# Patient Record
Sex: Male | Born: 1975
Health system: Southern US, Community
[De-identification: ages and names within clinical notes are randomized; demographics above are authoritative.]

## PROBLEM LIST (undated history)

## (undated) DIAGNOSIS — M549 Dorsalgia, unspecified: Secondary | ICD-10-CM

## (undated) DIAGNOSIS — I10 Essential (primary) hypertension: Secondary | ICD-10-CM

## (undated) DIAGNOSIS — G8929 Other chronic pain: Secondary | ICD-10-CM

## (undated) DIAGNOSIS — K859 Acute pancreatitis without necrosis or infection, unspecified: Secondary | ICD-10-CM

## (undated) HISTORY — PX: ORTHOPEDIC SURGERY: SHX850

---

## 1999-03-05 ENCOUNTER — Ambulatory Visit (HOSPITAL_COMMUNITY): Admission: RE | Admit: 1999-03-05 | Discharge: 1999-03-05 | Payer: Self-pay | Admitting: Urology

## 1999-03-05 ENCOUNTER — Encounter: Payer: Self-pay | Admitting: Urology

## 2000-11-09 ENCOUNTER — Encounter: Admission: RE | Admit: 2000-11-09 | Discharge: 2000-11-09 | Payer: Self-pay | Admitting: Urology

## 2000-11-09 ENCOUNTER — Encounter: Payer: Self-pay | Admitting: Urology

## 2001-02-23 ENCOUNTER — Emergency Department (HOSPITAL_COMMUNITY): Admission: EM | Admit: 2001-02-23 | Discharge: 2001-02-23 | Payer: Self-pay | Admitting: Emergency Medicine

## 2002-05-28 ENCOUNTER — Encounter: Payer: Self-pay | Admitting: Emergency Medicine

## 2002-05-29 ENCOUNTER — Encounter: Payer: Self-pay | Admitting: Orthopedic Surgery

## 2002-05-29 ENCOUNTER — Inpatient Hospital Stay (HOSPITAL_COMMUNITY): Admission: EM | Admit: 2002-05-29 | Discharge: 2002-05-31 | Payer: Self-pay | Admitting: Emergency Medicine

## 2002-07-25 ENCOUNTER — Encounter: Payer: Self-pay | Admitting: Emergency Medicine

## 2002-07-25 ENCOUNTER — Emergency Department (HOSPITAL_COMMUNITY): Admission: EM | Admit: 2002-07-25 | Discharge: 2002-07-25 | Payer: Self-pay | Admitting: Emergency Medicine

## 2002-08-20 ENCOUNTER — Emergency Department (HOSPITAL_COMMUNITY): Admission: EM | Admit: 2002-08-20 | Discharge: 2002-08-20 | Payer: Self-pay | Admitting: Physical Therapy

## 2003-01-05 ENCOUNTER — Emergency Department (HOSPITAL_COMMUNITY): Admission: EM | Admit: 2003-01-05 | Discharge: 2003-01-05 | Payer: Self-pay | Admitting: Emergency Medicine

## 2003-01-05 ENCOUNTER — Encounter: Payer: Self-pay | Admitting: Emergency Medicine

## 2003-01-12 ENCOUNTER — Emergency Department (HOSPITAL_COMMUNITY): Admission: EM | Admit: 2003-01-12 | Discharge: 2003-01-12 | Payer: Self-pay | Admitting: Emergency Medicine

## 2003-01-12 ENCOUNTER — Encounter: Payer: Self-pay | Admitting: Emergency Medicine

## 2003-05-03 ENCOUNTER — Emergency Department (HOSPITAL_COMMUNITY): Admission: EM | Admit: 2003-05-03 | Discharge: 2003-05-03 | Payer: Self-pay | Admitting: Emergency Medicine

## 2003-05-29 ENCOUNTER — Encounter: Admission: RE | Admit: 2003-05-29 | Discharge: 2003-06-19 | Payer: Self-pay | Admitting: Orthopedic Surgery

## 2003-07-02 ENCOUNTER — Ambulatory Visit (HOSPITAL_COMMUNITY): Admission: RE | Admit: 2003-07-02 | Discharge: 2003-07-02 | Payer: Self-pay | Admitting: Orthopedic Surgery

## 2003-07-30 ENCOUNTER — Emergency Department (HOSPITAL_COMMUNITY): Admission: EM | Admit: 2003-07-30 | Discharge: 2003-07-30 | Payer: Self-pay | Admitting: Emergency Medicine

## 2003-08-09 ENCOUNTER — Emergency Department (HOSPITAL_COMMUNITY): Admission: EM | Admit: 2003-08-09 | Discharge: 2003-08-09 | Payer: Self-pay | Admitting: Emergency Medicine

## 2003-08-20 ENCOUNTER — Emergency Department (HOSPITAL_COMMUNITY): Admission: EM | Admit: 2003-08-20 | Discharge: 2003-08-21 | Payer: Self-pay | Admitting: Emergency Medicine

## 2003-08-25 ENCOUNTER — Ambulatory Visit (HOSPITAL_COMMUNITY): Admission: RE | Admit: 2003-08-25 | Discharge: 2003-08-26 | Payer: Self-pay | Admitting: Emergency Medicine

## 2003-08-27 ENCOUNTER — Emergency Department (HOSPITAL_COMMUNITY): Admission: EM | Admit: 2003-08-27 | Discharge: 2003-08-28 | Payer: Self-pay | Admitting: Emergency Medicine

## 2003-10-18 ENCOUNTER — Emergency Department (HOSPITAL_COMMUNITY): Admission: EM | Admit: 2003-10-18 | Discharge: 2003-10-18 | Payer: Self-pay | Admitting: *Deleted

## 2003-10-23 ENCOUNTER — Emergency Department (HOSPITAL_COMMUNITY): Admission: EM | Admit: 2003-10-23 | Discharge: 2003-10-24 | Payer: Self-pay | Admitting: Emergency Medicine

## 2003-12-03 ENCOUNTER — Emergency Department (HOSPITAL_COMMUNITY): Admission: EM | Admit: 2003-12-03 | Discharge: 2003-12-03 | Payer: Self-pay | Admitting: *Deleted

## 2004-01-18 ENCOUNTER — Emergency Department (HOSPITAL_COMMUNITY): Admission: EM | Admit: 2004-01-18 | Discharge: 2004-01-18 | Payer: Self-pay | Admitting: Family Medicine

## 2004-02-29 ENCOUNTER — Emergency Department (HOSPITAL_COMMUNITY): Admission: EM | Admit: 2004-02-29 | Discharge: 2004-03-01 | Payer: Self-pay | Admitting: Emergency Medicine

## 2004-03-09 ENCOUNTER — Ambulatory Visit (HOSPITAL_COMMUNITY): Admission: RE | Admit: 2004-03-09 | Discharge: 2004-03-09 | Payer: Self-pay | Admitting: *Deleted

## 2004-03-24 ENCOUNTER — Encounter: Admission: RE | Admit: 2004-03-24 | Discharge: 2004-03-24 | Payer: Self-pay | Admitting: *Deleted

## 2004-04-06 ENCOUNTER — Encounter: Admission: RE | Admit: 2004-04-06 | Discharge: 2004-04-06 | Payer: Self-pay | Admitting: *Deleted

## 2004-04-26 ENCOUNTER — Encounter: Admission: RE | Admit: 2004-04-26 | Discharge: 2004-04-26 | Payer: Self-pay | Admitting: *Deleted

## 2004-06-14 ENCOUNTER — Emergency Department (HOSPITAL_COMMUNITY): Admission: EM | Admit: 2004-06-14 | Discharge: 2004-06-14 | Payer: Self-pay | Admitting: Family Medicine

## 2010-07-10 ENCOUNTER — Encounter: Payer: Self-pay | Admitting: Orthopedic Surgery

## 2010-07-11 ENCOUNTER — Encounter: Payer: Self-pay | Admitting: Anesthesiology

## 2012-03-06 ENCOUNTER — Emergency Department (HOSPITAL_COMMUNITY): Payer: Self-pay

## 2012-03-06 ENCOUNTER — Encounter (HOSPITAL_COMMUNITY): Payer: Self-pay

## 2012-03-06 DIAGNOSIS — R071 Chest pain on breathing: Secondary | ICD-10-CM | POA: Insufficient documentation

## 2012-03-06 DIAGNOSIS — R6883 Chills (without fever): Secondary | ICD-10-CM | POA: Insufficient documentation

## 2012-03-06 DIAGNOSIS — R51 Headache: Secondary | ICD-10-CM | POA: Insufficient documentation

## 2012-03-06 DIAGNOSIS — R110 Nausea: Secondary | ICD-10-CM | POA: Insufficient documentation

## 2012-03-06 DIAGNOSIS — R109 Unspecified abdominal pain: Secondary | ICD-10-CM | POA: Insufficient documentation

## 2012-03-06 DIAGNOSIS — R079 Chest pain, unspecified: Secondary | ICD-10-CM | POA: Insufficient documentation

## 2012-03-06 DIAGNOSIS — R0602 Shortness of breath: Secondary | ICD-10-CM | POA: Insufficient documentation

## 2012-03-06 LAB — PRO B NATRIURETIC PEPTIDE: Pro B Natriuretic peptide (BNP): 44.7 pg/mL (ref 0–125)

## 2012-03-06 LAB — BASIC METABOLIC PANEL
BUN: 8 mg/dL (ref 6–23)
CO2: 29 mEq/L (ref 19–32)
Calcium: 9.5 mg/dL (ref 8.4–10.5)
Chloride: 101 mEq/L (ref 96–112)
Creatinine, Ser: 1.23 mg/dL (ref 0.50–1.35)
GFR calc Af Amer: 85 mL/min — ABNORMAL LOW (ref >90)
GFR calc non Af Amer: 73 mL/min — ABNORMAL LOW (ref >90)
Glucose, Bld: 121 mg/dL — ABNORMAL HIGH (ref 70–99)
Potassium: 4.3 mEq/L (ref 3.5–5.1)
Sodium: 137 mEq/L (ref 135–145)

## 2012-03-06 LAB — CBC
HCT: 50.2 % (ref 39.0–52.0)
Hemoglobin: 17.4 g/dL — ABNORMAL HIGH (ref 13.0–17.0)
MCH: 31.2 pg (ref 26.0–34.0)
MCHC: 34.7 g/dL (ref 30.0–36.0)
MCV: 90.1 fL (ref 78.0–100.0)
Platelets: 375 10*3/uL (ref 150–400)
RBC: 5.57 MIL/uL (ref 4.22–5.81)
RDW: 13.4 % (ref 11.5–15.5)
WBC: 12.1 10*3/uL — ABNORMAL HIGH (ref 4.0–10.5)

## 2012-03-06 LAB — POCT I-STAT TROPONIN I: Troponin i, poc: 0.03 ng/mL (ref 0.00–0.08)

## 2012-03-06 NOTE — ED Notes (Signed)
Pt reports (L) side chest pain and SOB that increases w/exertion, decrease appetite, dry mouth, diaphoretic, upper abd pain, mid back pain w/deep breathing x1 week. Pt reports a hx of pancreatitis, sts, "I think I have a hiatal hernia."

## 2012-03-07 ENCOUNTER — Emergency Department (HOSPITAL_COMMUNITY): Payer: Self-pay

## 2012-03-07 ENCOUNTER — Emergency Department (HOSPITAL_COMMUNITY)
Admission: EM | Admit: 2012-03-07 | Discharge: 2012-03-07 | Disposition: A | Discharge status: Home / Self Care | Payer: Self-pay | Attending: Emergency Medicine | Admitting: Emergency Medicine

## 2012-03-07 DIAGNOSIS — R109 Unspecified abdominal pain: Secondary | ICD-10-CM

## 2012-03-07 DIAGNOSIS — R0789 Other chest pain: Secondary | ICD-10-CM

## 2012-03-07 HISTORY — DX: Other chronic pain: G89.29

## 2012-03-07 HISTORY — DX: Dorsalgia, unspecified: M54.9

## 2012-03-07 HISTORY — DX: Acute pancreatitis without necrosis or infection, unspecified: K85.90

## 2012-03-07 LAB — HEPATIC FUNCTION PANEL
ALT: 46 U/L (ref 0–53)
AST: 42 U/L — ABNORMAL HIGH (ref 0–37)
Albumin: 3.4 g/dL — ABNORMAL LOW (ref 3.5–5.2)
Alkaline Phosphatase: 44 U/L (ref 39–117)
Bilirubin, Direct: 0.1 mg/dL (ref 0.0–0.3)
Indirect Bilirubin: 0.2 mg/dL — ABNORMAL LOW (ref 0.3–0.9)
Total Bilirubin: 0.3 mg/dL (ref 0.3–1.2)
Total Protein: 6.8 g/dL (ref 6.0–8.3)

## 2012-03-07 LAB — CK TOTAL AND CKMB (NOT AT ARMC): Relative Index: 1.1 (ref 0.0–2.5)

## 2012-03-07 LAB — LIPASE, BLOOD: Lipase: 86 U/L — ABNORMAL HIGH (ref 11–59)

## 2012-03-07 MED ORDER — GI COCKTAIL ~~LOC~~
30.0000 mL | Freq: Once | ORAL | Status: AC
  Administered 2012-03-07: 30 mL via ORAL
  Filled 2012-03-07: qty 30

## 2012-03-07 MED ORDER — HYDROMORPHONE HCL PF 1 MG/ML IJ SOLN
1.0000 mg | INTRAMUSCULAR | Status: AC
  Administered 2012-03-07: 1 mg via INTRAVENOUS
  Filled 2012-03-07: qty 1

## 2012-03-07 MED ORDER — HYDROCODONE-ACETAMINOPHEN 5-325 MG PO TABS: 2.0000 | ORAL_TABLET | ORAL | Status: DC | PRN

## 2012-03-07 MED ORDER — SODIUM CHLORIDE 0.9 % IV BOLUS (SEPSIS)
2000.0000 mL | Freq: Once | INTRAVENOUS | Status: AC
  Administered 2012-03-07: 2000 mL via INTRAVENOUS

## 2012-03-07 MED ORDER — IBUPROFEN 600 MG PO TABS: 600.0000 mg | ORAL_TABLET | Freq: Four times a day (QID) | ORAL | Status: DC | PRN

## 2012-03-07 MED ORDER — KETOROLAC TROMETHAMINE 15 MG/ML IJ SOLN
15.0000 mg | Freq: Once | INTRAMUSCULAR | Status: AC
  Administered 2012-03-07: 15 mg via INTRAVENOUS
  Filled 2012-03-07: qty 1

## 2012-03-07 MED ORDER — IOHEXOL 350 MG/ML SOLN: 100.0000 mL | Freq: Once | INTRAVENOUS | Status: DC | PRN

## 2012-03-07 NOTE — ED Notes (Signed)
Patient complaint of chest pain   Vitals rechecked.

## 2012-03-07 NOTE — ED Provider Notes (Signed)
History     CSN: [id number]  Arrival date & time 03/06/12  1723   First MD Initiated Contact with Patient 03/07/12 0037      Chief Complaint  Patient presents with  . Chest Pain    (Consider location/radiation/quality/duration/timing/severity/associated sxs/prior treatment) HPI Brett Herrera is a 36 y.o. male with history of pancreatitis who presents with 2 weeks of "heavy" chest pain which is associated with shortness of breath, nausea, sweating, anorexia, dizzy spells.  He says he said it worsened over the last 2 days, he has not taken anything for it. He says he cannot sleep. He says his chest pain is centered over the xiphoid process and it extends under his left, he says is a sharp stabbing pain is 10 out of 10 in intensity. He says he has had a cough which is been a small amount of phlegm does hurt when he coughs, and breathing hurts worse on deep inspiration. No personal history of DVT or PE, no hemoptysis, is not taking any hormones and denies any antecedent trauma. He has not had any cancer treatments.  Patient also complains of a stuffy nose, runny nose, itchy watery eyes, headaches but no sore throat.  Patient works out frequently and is on intense workout program.  Past Medical History  Diagnosis Date  . Pancreatitis   . Chronic back pain     Past Surgical History  Procedure Date  . Orthopedic surgery     History reviewed. No pertinent family history.  History  Substance Use Topics  . Smoking status: Never Smoker   . Smokeless tobacco: Not on file  . Alcohol Use: No      Review of Systems At least 10pt or greater review of systems completed and are negative except where specified in the HPI.  Allergies  Review of patient's allergies indicates not on file.  Home Medications  No current outpatient prescriptions on file.  BP 163/92  Pulse 82  Temp 99.8 F (37.7 C) (Oral)  Resp 20  SpO2 98%  Physical Exam  Nursing notes reviewed.  Electronic  medical record reviewed. VITAL SIGNS:   Filed Vitals:   03/07/12 0030 03/07/12 0046 03/07/12 0100 03/07/12 0200  BP: 173/75 163/92 155/97 144/87  Pulse: 82  86 71  Temp:      TempSrc:      Resp:  20 20 15   SpO2: 100% 98% 99% 96%   CONSTITUTIONAL: Awake, oriented, appears non-toxic, multiple tattoos and piercings. Heavily muscled HENT: Atraumatic, normocephalic, oral mucosa pink and moist, airway patent. Nares patent without drainage. External ears normal. EYES: Conjunctiva clear, EOMI, PERRLA NECK: Trachea midline, non-tender, supple CARDIOVASCULAR: Normal heart rate, Normal rhythm, No murmurs, rubs, gallops PULMONARY/CHEST: Clear to auscultation, no rhonchi, wheezes, or rales. Symmetrical breath sounds. Chest wall is tender to palpation over the epigastrium and left side of the chest. Patient has a prominent xiphoid process which turns outward, anteriorly ABDOMINAL: Non-distended, soft, non-tender - no rebound or guarding.  BS normal. NEUROLOGIC: Non-focal, moving all four extremities, no gross sensory or motor deficits. EXTREMITIES: No clubbing, cyanosis, or edema SKIN: Warm, Dry, No erythema, No rash  ED Course  Procedures (including critical care time)  Date: 03/08/2012  Rate: 101  Rhythm: normal sinus rhythm  QRS Axis: normal  Intervals: normal  ST/T Wave abnormalities: normal  Conduction Disutrbances: none  Narrative Interpretation: Sinus tachycardia.  Labs Reviewed  CBC - Abnormal; Notable for the following:    WBC 12.1 (*)  Hemoglobin 17.4 (*)     All other components within normal limits  BASIC METABOLIC PANEL - Abnormal; Notable for the following:    Glucose, Bld 121 (*)     GFR calc non Af Amer 73 (*)     GFR calc Af Amer 85 (*)     All other components within normal limits  HEPATIC FUNCTION PANEL - Abnormal; Notable for the following:    Albumin 3.4 (*)     AST 42 (*)     Indirect Bilirubin 0.2 (*)     All other components within normal limits  LIPASE,  BLOOD - Abnormal; Notable for the following:    Lipase 86 (*)     All other components within normal limits  CK TOTAL AND CKMB - Abnormal; Notable for the following:    Total CK 448 (*)     CK, MB 4.8 (*)     All other components within normal limits  PRO B NATRIURETIC PEPTIDE  POCT I-STAT TROPONIN I   Dg Chest 2 View  03/06/2012  *RADIOLOGY REPORT*  Clinical Data: Chest pain  CHEST - 2 VIEW  Comparison: None.  Findings: Cardiomediastinal contours within normal range.  Lungs are clear.  No pleural effusion or pneumothorax.  No acute osseous finding.  IMPRESSION: No radiographic evidence of acute cardiopulmonary process.   Original Report Authenticated By: Waneta Martins, M.D.    Ct Angio Chest W/cm &/or Wo Cm  03/07/2012  *RADIOLOGY REPORT*  Clinical Data:  Chest pain and epigastric pain.  Nausea and chills.  CT ANGIOGRAPHY CHEST CT ABDOMEN AND PELVIS WITH CONTRAST  Technique:  Multidetector CT imaging of the chest was performed using the standard protocol during bolus administration of intravenous contrast.  Multiplanar CT image reconstructions including MIPs were obtained to evaluate the vascular anatomy. Multidetector CT imaging of the abdomen and pelvis was performed using the standard protocol during bolus administration of intravenous contrast.  Contrast:  100 ml Omnipaque 350.  Comparison:  Plain films lumbar spine 02/29/2004.  CTA CHEST  Findings:  No pulmonary embolus is identified.  Heart size is normal.  No axillary, hilar or mediastinal lymphadenopathy.  No pleural or pericardial effusion.  The lungs demonstrate only some mild dependent atelectasis.  No focal bony abnormality.   Review of the MIP images confirms the above findings.  IMPRESSION: Negative for pulmonary embolus.  Negative exam.  CT ABDOMEN AND PELVIS  Findings: The gallbladder, liver, biliary tree, adrenal glands, spleen, kidneys and pancreas all appear normal.  There is no lymphadenopathy or fluid.  The stomach, small and  large bowel and appendix are all normal appearance. There is a medullary sclerosis in the right iliac wing which is seen on the comparison examination.  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  No other focal bony abnormality.  Review of the MIP images confirms the above findings.  IMPRESSION: Negative exam.   Original Report Authenticated By: Bernadene Bell. Maricela Curet, M.D.    Ct Abdomen Pelvis W Contrast  03/07/2012  *RADIOLOGY REPORT*  Clinical Data:  Chest pain and epigastric pain.  Nausea and chills.  CT ANGIOGRAPHY CHEST CT ABDOMEN AND PELVIS WITH CONTRAST  Technique:  Multidetector CT imaging of the chest was performed using the standard protocol during bolus administration of intravenous contrast.  Multiplanar CT image reconstructions including MIPs were obtained to evaluate the vascular anatomy. Multidetector CT imaging of the abdomen and pelvis was performed using the standard protocol during bolus administration of intravenous contrast.  Contrast:  100 ml Omnipaque 350.  Comparison:  Plain films lumbar spine 02/29/2004.  CTA CHEST  Findings:  No pulmonary embolus is identified.  Heart size is normal.  No axillary, hilar or mediastinal lymphadenopathy.  No pleural or pericardial effusion.  The lungs demonstrate only some mild dependent atelectasis.  No focal bony abnormality.   Review of the MIP images confirms the above findings.  IMPRESSION: Negative for pulmonary embolus.  Negative exam.  CT ABDOMEN AND PELVIS  Findings: The gallbladder, liver, biliary tree, adrenal glands, spleen, kidneys and pancreas all appear normal.  There is no lymphadenopathy or fluid.  The stomach, small and large bowel and appendix are all normal appearance. There is a medullary sclerosis in the right iliac wing which is seen on the comparison examination.  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  No other focal bony abnormality.  Review of the MIP images confirms the above findings.  IMPRESSION: Negative exam.   Original  Report Authenticated By: Bernadene Bell. D'ALESSIO, M.D.      1. Chest wall pain   2. Abdominal wall pain       MDM  Brett Herrera is a 36 y.o. male presenting with a host of different complaints centered around chest pain and shortness of breath. I suspicion for ACS, PE is initially quite low however patient's initial laboratory workup was unremarkable the patient remained in a fair amount of pain despite pain medicine.  More aggressive imaging was pursued including a CT angiography chest to rule out PE and CT of the abdomen and pelvis to rule out acute pancreatitis as patient did have an elevation in his lipase, does deny drinking alcohol. CTs were both within normal limits, unremarkable.  Patient does have an elevated CK with a marginally elevated MB, but this is largely related to the patient's workout regimen. Patient denies working out in the heart or the usual, but male companion asserts that he works out hard and does not stop working out  even when having discomfort.  Patient is urged to use nonsteroidal anti-inflammatories, and given a prescription for Vicodin for chest wall pain. The patient is having severe chest wall or muscular pain due to his workout regimen.  No evidence or history supports IV drug abuse for either recreational drugs or  anabolic steroid use.  I explained the diagnosis and have given explicit precautions to return to the ER including worsening pain is not helped with medications or any other new or worsening symptoms. The patient understands and accepts the medical plan as it's been dictated and I have answered their questions. Discharge instructions concerning home care and prescriptions have been given.  The patient is STABLE and is discharged to home in good condition.         Jones Skene, MD 03/08/12 4098

## 2013-03-04 ENCOUNTER — Emergency Department (HOSPITAL_COMMUNITY)
Admission: EM | Admit: 2013-03-04 | Discharge: 2013-03-04 | Disposition: A | Discharge status: Home / Self Care | Payer: Self-pay | Attending: Emergency Medicine | Admitting: Emergency Medicine

## 2013-03-04 ENCOUNTER — Encounter (HOSPITAL_COMMUNITY): Payer: Self-pay | Admitting: *Deleted

## 2013-03-04 DIAGNOSIS — Z79899 Other long term (current) drug therapy: Secondary | ICD-10-CM | POA: Insufficient documentation

## 2013-03-04 DIAGNOSIS — K089 Disorder of teeth and supporting structures, unspecified: Secondary | ICD-10-CM | POA: Insufficient documentation

## 2013-03-04 DIAGNOSIS — K029 Dental caries, unspecified: Secondary | ICD-10-CM | POA: Insufficient documentation

## 2013-03-04 DIAGNOSIS — R42 Dizziness and giddiness: Secondary | ICD-10-CM | POA: Insufficient documentation

## 2013-03-04 DIAGNOSIS — G8929 Other chronic pain: Secondary | ICD-10-CM | POA: Insufficient documentation

## 2013-03-04 DIAGNOSIS — H9319 Tinnitus, unspecified ear: Secondary | ICD-10-CM | POA: Insufficient documentation

## 2013-03-04 DIAGNOSIS — R6883 Chills (without fever): Secondary | ICD-10-CM | POA: Insufficient documentation

## 2013-03-04 DIAGNOSIS — K047 Periapical abscess without sinus: Secondary | ICD-10-CM

## 2013-03-04 DIAGNOSIS — Z8719 Personal history of other diseases of the digestive system: Secondary | ICD-10-CM | POA: Insufficient documentation

## 2013-03-04 DIAGNOSIS — R110 Nausea: Secondary | ICD-10-CM | POA: Insufficient documentation

## 2013-03-04 MED ORDER — OXYCODONE-ACETAMINOPHEN 5-325 MG PO TABS: 1.0000 | ORAL_TABLET | Freq: Four times a day (QID) | ORAL | Status: DC | PRN

## 2013-03-04 MED ORDER — PENICILLIN V POTASSIUM 500 MG PO TABS: 500.0000 mg | ORAL_TABLET | Freq: Four times a day (QID) | ORAL | Status: DC

## 2013-03-04 MED ORDER — OXYCODONE-ACETAMINOPHEN 5-325 MG PO TABS
1.0000 | ORAL_TABLET | Freq: Once | ORAL | Status: AC
  Administered 2013-03-04: 1 via ORAL
  Filled 2013-03-04: qty 1

## 2013-03-04 MED ORDER — KETOROLAC TROMETHAMINE 60 MG/2ML IM SOLN
60.0000 mg | Freq: Once | INTRAMUSCULAR | Status: AC
  Administered 2013-03-04: 60 mg via INTRAMUSCULAR
  Filled 2013-03-04: qty 2

## 2013-03-04 NOTE — ED Notes (Signed)
Pt has some broken teeth to back left lower mouth, started swelling, took friend's amoxicillin and symptoms approved.  Pt states 48 hours the pain and swelling came back

## 2013-03-04 NOTE — ED Provider Notes (Signed)
CSN: [id number]     Arrival date & time 03/04/13  1837 History   First MD Initiated Contact with Patient 03/04/13 1936     Chief Complaint  Patient presents with  . Jaw Pain   (Consider location/radiation/quality/duration/timing/severity/associated sxs/prior Treatment) HPI Pt presents to the emergency room with 2 week history of jaw pain due to a cracked tooth on his lower left side. Pt reported jaw swelling and tooth pain after cracking his molar. He took his friends Amoxicillin 500 mg for 7-8 days; the swelling resolved and the pain dropped from 10/10 to a 5/10. He used cold compresses and warm salt water washes. Pt completed 7-8 day course of Amoxicillin and began experiencing severe pain and swelling after finishing the ABX course. Pain is now "15/10" throbbing pain radiating to ear and neck. He reports chills, dizziness, tinnitus, and nausea from the pain. ROM of mouth, neck, and mouth is limited due to pain. Denies fever, abdominal pain, vomiting, visual changes, weakness, dysphagia, shortness of breath, chest pain. Pt has tried to control pain with ibuprofen, but nothing makes pain better. Movement and contact with area makes pain worse. Pt is concerned because he does not have health insurance or a dentist. Past Medical History  Diagnosis Date  . Pancreatitis   . Chronic back pain    Past Surgical History  Procedure Laterality Date  . Orthopedic surgery     No family history on file. History  Substance Use Topics  . Smoking status: Never Smoker   . Smokeless tobacco: Not on file  . Alcohol Use: No    Review of Systems All other systems negative except as documented in the HPI. All pertinent positives and negatives as reviewed in the HPI.  Allergies  Review of patient's allergies indicates no known allergies.  Home Medications   Current Outpatient Rx  Name  Route  Sig  Dispense  Refill  . Multiple Vitamin (ONE-A-DAY MENS PO)   Oral   Take 1 tablet by mouth daily.        . naproxen sodium (ANAPROX) 220 MG tablet   Oral   Take 220 mg by mouth as needed (pain).          BP 151/87  Pulse 67  Temp(Src) 98.2 F (36.8 C) (Oral)  Resp 18  SpO2 100% Physical Exam  Nursing note and vitals reviewed. Constitutional: He is oriented to person, place, and time. He appears well-developed and well-nourished. No distress.  HENT:  Head:    Right Ear: Tympanic membrane, external ear and ear canal normal. No tenderness.  Left Ear: Tympanic membrane and ear canal normal. There is tenderness.  Mouth/Throat: Uvula is midline, oropharynx is clear and moist and mucous membranes are normal. No oral lesions. Abnormal dentition. Dental abscesses and dental caries present. No edematous or lacerations. No oropharyngeal exudate, posterior oropharyngeal edema, posterior oropharyngeal erythema or tonsillar abscesses.    Poor dentition noted; multiple teeth missing. Portion of posterior left lower molar missing. Erythema and edema in gingiva surrounding left lower molars and buccal mucosa of left cheek. No focal area of drainage, erythema, or injury noted. Tender to palpation over left mandible, posterior portion of maxilla, and zygomatic arch. Tenderness extends to left postauricular area. Jaw ROM is limited due to pain.  Eyes: Pupils are equal, round, and reactive to light.  Neck: Trachea normal. Neck supple. No spinous process tenderness and no muscular tenderness present. Edema present. No tracheal deviation, no erythema and normal range of motion (secondary to  pain.) present. No mass and no thyromegaly present.  Cardiovascular: Normal rate, regular rhythm, normal heart sounds and intact distal pulses.   No murmur heard. Pulmonary/Chest: Effort normal and breath sounds normal. No respiratory distress. He has no wheezes. He has no rales. He exhibits no tenderness.  Lymphadenopathy:       Head (right side): No submandibular, no tonsillar, no preauricular, no posterior  auricular and no occipital adenopathy present.  Lymphatic exam limited on left side due to pain. Edema noted over submandibular and tonsillar lymph nodes.  Neurological: He is alert and oriented to person, place, and time. He has normal strength and normal reflexes. No cranial nerve deficit or sensory deficit.  Skin: Skin is warm and dry. No rash noted. He is not diaphoretic. No erythema.  Psychiatric: He has a normal mood and affect. His behavior is normal. Judgment and thought content normal.    ED Course  Procedures (including critical care time) Patient be referred to dentistry for further evaluation.  Patient be given by mouth antibiotics.  MDM      Carlyle Dolly, PA-C 03/04/13 2053

## 2013-03-05 NOTE — ED Provider Notes (Signed)
Medical screening examination/treatment/procedure(s) were performed by non-physician practitioner and as supervising physician I was immediately available for consultation/collaboration.  Flint Melter, MD 03/05/13 [phone]

## 2013-05-05 ENCOUNTER — Encounter (HOSPITAL_COMMUNITY): Payer: Self-pay | Admitting: Emergency Medicine

## 2013-05-05 ENCOUNTER — Emergency Department (HOSPITAL_COMMUNITY)
Admission: EM | Admit: 2013-05-05 | Discharge: 2013-05-05 | Disposition: A | Discharge status: Home / Self Care | Payer: Self-pay | Attending: Emergency Medicine | Admitting: Emergency Medicine

## 2013-05-05 DIAGNOSIS — Z8719 Personal history of other diseases of the digestive system: Secondary | ICD-10-CM | POA: Insufficient documentation

## 2013-05-05 DIAGNOSIS — Z8739 Personal history of other diseases of the musculoskeletal system and connective tissue: Secondary | ICD-10-CM | POA: Insufficient documentation

## 2013-05-05 DIAGNOSIS — K047 Periapical abscess without sinus: Secondary | ICD-10-CM | POA: Insufficient documentation

## 2013-05-05 MED ORDER — OXYCODONE-ACETAMINOPHEN 5-325 MG PO TABS: 2.0000 | ORAL_TABLET | ORAL | Status: DC | PRN

## 2013-05-05 MED ORDER — PENICILLIN V POTASSIUM 500 MG PO TABS: 500.0000 mg | ORAL_TABLET | Freq: Four times a day (QID) | ORAL | Status: AC

## 2013-05-05 MED ORDER — PENICILLIN V POTASSIUM 250 MG PO TABS
500.0000 mg | ORAL_TABLET | Freq: Once | ORAL | Status: AC
  Administered 2013-05-05: 500 mg via ORAL
  Filled 2013-05-05: qty 2

## 2013-05-05 MED ORDER — IBUPROFEN 800 MG PO TABS: 800.0000 mg | ORAL_TABLET | Freq: Three times a day (TID) | ORAL | Status: DC

## 2013-05-05 MED ORDER — OXYCODONE-ACETAMINOPHEN 5-325 MG PO TABS
2.0000 | ORAL_TABLET | Freq: Once | ORAL | Status: AC
  Administered 2013-05-05: 2 via ORAL
  Filled 2013-05-05: qty 2

## 2013-05-05 NOTE — ED Notes (Signed)
Pt states that he has been having dental pain that has turned into an abscess. Pt states painful to chew.

## 2013-05-05 NOTE — ED Provider Notes (Signed)
CSN: [id number]     Arrival date & time 05/05/13  7829 History   First MD Initiated Contact with Patient 05/05/13 0503     Chief Complaint  Patient presents with  . Dental Pain  . Abscess   (Consider location/radiation/quality/duration/timing/severity/associated sxs/prior Treatment) HPI History provided by patient. Left lower dental pain x2 days worse today now with associated swelling to his left jaw. Hurts to chew, hurts to try to open his mouth. No fevers or chills. No nausea vomiting. History of same about a month ago improved with antibiotics. He has not seen a dentist. Pain is sharp and moderate to severe. No sore throat or trouble breathing Past Medical History  Diagnosis Date  . Pancreatitis   . Chronic back pain    Past Surgical History  Procedure Laterality Date  . Orthopedic surgery     History reviewed. No pertinent family history. History  Substance Use Topics  . Smoking status: Never Smoker   . Smokeless tobacco: Not on file  . Alcohol Use: No    Review of Systems  Constitutional: Negative for fever and chills.  HENT: Positive for dental problem.   Eyes: Negative for pain.  Respiratory: Negative for shortness of breath.   Cardiovascular: Negative for chest pain.  Gastrointestinal: Negative for abdominal pain.  Genitourinary: Negative for dysuria.  Musculoskeletal: Negative for back pain, neck pain and neck stiffness.  Skin: Negative for rash.  Neurological: Negative for headaches.  All other systems reviewed and are negative.    Allergies  Review of patient's allergies indicates no known allergies.  Home Medications  No current outpatient prescriptions on file. BP 172/95  Pulse 72  Temp(Src) 98.4 F (36.9 C) (Oral)  Resp 16  SpO2 99% Physical Exam  Constitutional: He is oriented to person, place, and time. He appears well-developed and well-nourished.  HENT:  Head: Normocephalic.  Tender left lower first molar with associated facial swelling  and moderate trismus. Uvula midline. No submandibular swelling or tenderness   Eyes: EOM are normal. Pupils are equal, round, and reactive to light.  Neck: Neck supple. No tracheal deviation present.  Cardiovascular: Normal rate, regular rhythm and intact distal pulses.   Pulmonary/Chest: Effort normal. No stridor. No respiratory distress.  Musculoskeletal: Normal range of motion. He exhibits no edema.  Neurological: He is alert and oriented to person, place, and time.  Skin: Skin is warm and dry.    ED Course  Dental Date/Time: 05/05/2013 5:40 AM Performed by: Sunnie Nielsen Authorized by: Sunnie Nielsen Consent: Verbal consent obtained. Risks and benefits: risks, benefits and alternatives were discussed Consent given by: patient Patient understanding: patient states understanding of the procedure being performed Patient consent: the patient's understanding of the procedure matches consent given Procedure consent: procedure consent matches procedure scheduled Required items: required blood products, implants, devices, and special equipment available Patient identity confirmed: verbally with patient Time out: Immediately prior to procedure a "time out" was called to verify the correct patient, procedure, equipment, support staff and site/side marked as required. Preparation: Patient was prepped and draped in the usual sterile fashion. Local anesthesia used: yes Anesthesia: local infiltration Local anesthetic: bupivacaine 0.5% without epinephrine Anesthetic total: 1.8 ml Patient tolerance: Patient tolerated the procedure well with no immediate complications. Comments: Left lower first molar local injection, some serosanguineous drainage of abscess   (including critical care time)  Percocet provided  Discharge home with antibiotics and pain medications. Dental referral provided. Strict return precautions verbalized is understood.  MDM  Diagnoses: Dental abscess  Medications  provide Dental block  Vital signs and nursing notes reviewed and considered    Sunnie Nielsen, MD 05/05/13 [phone]

## 2013-11-15 ENCOUNTER — Emergency Department (HOSPITAL_COMMUNITY)
Admission: EM | Admit: 2013-11-15 | Discharge: 2013-11-15 | Disposition: A | Discharge status: Home / Self Care | Payer: Self-pay | Attending: Emergency Medicine | Admitting: Emergency Medicine

## 2013-11-15 ENCOUNTER — Encounter (HOSPITAL_COMMUNITY): Payer: Self-pay | Admitting: Emergency Medicine

## 2013-11-15 DIAGNOSIS — IMO0002 Reserved for concepts with insufficient information to code with codable children: Secondary | ICD-10-CM | POA: Insufficient documentation

## 2013-11-15 DIAGNOSIS — Y9389 Activity, other specified: Secondary | ICD-10-CM | POA: Insufficient documentation

## 2013-11-15 DIAGNOSIS — Z791 Long term (current) use of non-steroidal anti-inflammatories (NSAID): Secondary | ICD-10-CM | POA: Insufficient documentation

## 2013-11-15 DIAGNOSIS — Z8719 Personal history of other diseases of the digestive system: Secondary | ICD-10-CM | POA: Insufficient documentation

## 2013-11-15 DIAGNOSIS — X500XXA Overexertion from strenuous movement or load, initial encounter: Secondary | ICD-10-CM | POA: Insufficient documentation

## 2013-11-15 DIAGNOSIS — Y9289 Other specified places as the place of occurrence of the external cause: Secondary | ICD-10-CM | POA: Insufficient documentation

## 2013-11-15 DIAGNOSIS — S76219A Strain of adductor muscle, fascia and tendon of unspecified thigh, initial encounter: Secondary | ICD-10-CM

## 2013-11-15 DIAGNOSIS — G8929 Other chronic pain: Secondary | ICD-10-CM | POA: Insufficient documentation

## 2013-11-15 MED ORDER — DIAZEPAM 5 MG PO TABS
10.0000 mg | ORAL_TABLET | Freq: Once | ORAL | Status: AC
  Administered 2013-11-15: 10 mg via ORAL
  Filled 2013-11-15: qty 2

## 2013-11-15 MED ORDER — OXYCODONE-ACETAMINOPHEN 5-325 MG PO TABS
1.0000 | ORAL_TABLET | Freq: Once | ORAL | Status: AC
  Administered 2013-11-15: 1 via ORAL
  Filled 2013-11-15: qty 1

## 2013-11-15 MED ORDER — DIAZEPAM 5 MG PO TABS: 5.0000 mg | ORAL_TABLET | Freq: Three times a day (TID) | ORAL | Status: DC | PRN

## 2013-11-15 MED ORDER — OXYCODONE-ACETAMINOPHEN 5-325 MG PO TABS: 1.0000 | ORAL_TABLET | Freq: Four times a day (QID) | ORAL | Status: DC | PRN

## 2013-11-15 MED ORDER — IBUPROFEN 600 MG PO TABS: 600.0000 mg | ORAL_TABLET | Freq: Four times a day (QID) | ORAL | Status: DC | PRN

## 2013-11-15 NOTE — ED Provider Notes (Signed)
CSN: [id number]     Arrival date & time 11/15/13  0251 History   First MD Initiated Contact with Patient 11/15/13 0500     Chief Complaint  Patient presents with  . Groin Pain    Left  . Leg Pain    Left     (Consider location/radiation/quality/duration/timing/severity/associated sxs/prior Treatment) HPI Comments: Patient states he was working out at the gym 2 weeks, ago, when he felt a pop in his left groin.  For the past 2, weeks.  He, has tried rest, anti-inflammatories, ice, heat, and thought it was getting better, but 2 days ago.  The pain became worse.  He has been unable to rest or sleep.  He, states he gets a spasm or tearing sensation in his groin that radiates into the anterior portion of his left quad  Patient is a 38 y.o. male presenting with groin pain and leg pain. The history is provided by the patient.  Groin Pain This is a new problem. The current episode started 1 to 4 weeks ago. The problem occurs constantly. The problem has been gradually worsening. Pertinent negatives include no fever, joint swelling, rash or weakness. The symptoms are aggravated by walking. He has tried immobilization, lying down, relaxation, position changes and rest for the symptoms. The treatment provided no relief.  Leg Pain Associated symptoms: no back pain and no fever     Past Medical History  Diagnosis Date  . Pancreatitis   . Chronic back pain    Past Surgical History  Procedure Laterality Date  . Orthopedic surgery     History reviewed. No pertinent family history. History  Substance Use Topics  . Smoking status: Never Smoker   . Smokeless tobacco: Not on file  . Alcohol Use: No    Review of Systems  Unable to perform ROS Constitutional: Negative for fever.  Cardiovascular: Positive for leg swelling.  Genitourinary: Negative for dysuria.  Musculoskeletal: Positive for gait problem. Negative for back pain and joint swelling.  Skin: Negative for rash and wound.   Neurological: Negative for dizziness and weakness.  All other systems reviewed and are negative.     Allergies  Review of patient's allergies indicates no known allergies.  Home Medications   Prior to Admission medications   Medication Sig Start Date End Date Taking? Authorizing Provider  ibuprofen (ADVIL,MOTRIN) 800 MG tablet Take 1 tablet (800 mg total) by mouth 3 (three) times daily. 05/05/13  Yes Sunnie NielsenBrian Opitz, MD  diazepam (VALIUM) 5 MG tablet Take 1 tablet (5 mg total) by mouth every 8 (eight) hours as needed for anxiety. 11/15/13   Arman FilterGail K Ben Habermann, NP  ibuprofen (ADVIL,MOTRIN) 600 MG tablet Take 1 tablet (600 mg total) by mouth every 6 (six) hours as needed. 11/15/13   Arman FilterGail K Rubel Heckard, NP  oxyCODONE-acetaminophen (PERCOCET/ROXICET) 5-325 MG per tablet Take 1 tablet by mouth every 6 (six) hours as needed for severe pain. 11/15/13   Arman FilterGail K Leaann Nevils, NP   BP 158/96  Pulse 71  Temp(Src) 99 F (37.2 C) (Oral)  Resp 22  Ht 6\' 4"  (1.93 m)  Wt 215 lb (97.523 kg)  BMI 26.18 kg/m2  SpO2 97% Physical Exam  Constitutional: He appears well-developed.  HENT:  Head: Normocephalic.  Eyes: Pupils are equal, round, and reactive to light.  Neck: Normal range of motion.  Cardiovascular: Normal rate.   Pulmonary/Chest: Effort normal.  Musculoskeletal: Normal range of motion.  Neurological: He is alert.  Skin: Skin is warm. No erythema.  ED Course  Procedures (including critical care time) Labs Review Labs Reviewed - No data to display  Imaging Review No results found.   EKG Interpretation None      MDM  Patient's been given 10 mg Valium.  I will send description 5 mg Valium 3 times a day for muscle spasm, as well as Percocet for, pain.  I've also tried groin, starting support.  Patient states he feels significantly better with the Ace bandage in place.  I recommend that he followup with orthopedics in a timely manner Final diagnoses:  Strain of groin        Arman Filter,  NP 11/15/13 0600

## 2013-11-15 NOTE — ED Notes (Signed)
Patient explains that he was doing leg workouts about 2 weeks ago and felt a pop in his left groin region. Pain has gradually gotten worse in spite of rest, heat, and anti-inflammatory medications.

## 2013-11-15 NOTE — Discharge Instructions (Signed)
We tried groin strapping, which seemed to give you some relief of your discomfort if you make an appointment with your orthopedist.  They can provide you with a easier to use.  Strapping, mechanism to use over the next 2-3 months.  As discussed, groin, strains, range in severity from grade 1 to grade 3 it appears that you have between a grade 1 and grade 2 strain, you do not have a complete tear as there was no bruising to your groin, but they do take between 4 and 12 weeks to heal completely.  Again.  Please make an appointment with the orthopedist, who can put you into a physical therapy program at [facility], you can do gentle exercises in the water, which will help the muscle.  Heal as well

## 2013-11-15 NOTE — ED Provider Notes (Signed)
Medical screening examination/treatment/procedure(s) were performed by non-physician practitioner and as supervising physician I was immediately available for consultation/collaboration.   EKG Interpretation None       Suzzanne Brunkhorst M Gustava Berland, MD 11/15/13 0640 

## 2014-02-19 ENCOUNTER — Emergency Department (HOSPITAL_COMMUNITY)
Admission: EM | Admit: 2014-02-19 | Discharge: 2014-02-20 | Disposition: A | Discharge status: Home / Self Care | Payer: Self-pay | Attending: Emergency Medicine | Admitting: Emergency Medicine

## 2014-02-19 ENCOUNTER — Encounter (HOSPITAL_COMMUNITY): Payer: Self-pay | Admitting: Emergency Medicine

## 2014-02-19 DIAGNOSIS — Z79899 Other long term (current) drug therapy: Secondary | ICD-10-CM | POA: Insufficient documentation

## 2014-02-19 DIAGNOSIS — T148XXA Other injury of unspecified body region, initial encounter: Secondary | ICD-10-CM

## 2014-02-19 DIAGNOSIS — Z8719 Personal history of other diseases of the digestive system: Secondary | ICD-10-CM | POA: Insufficient documentation

## 2014-02-19 DIAGNOSIS — IMO0002 Reserved for concepts with insufficient information to code with codable children: Secondary | ICD-10-CM | POA: Insufficient documentation

## 2014-02-19 DIAGNOSIS — G8929 Other chronic pain: Secondary | ICD-10-CM | POA: Insufficient documentation

## 2014-02-19 NOTE — ED Notes (Signed)
The pt experienced a groin tear 2  Months  Agio and this past weekend he felt a sharp pain in the groin that went through to his buttocks and th pasun has been intense and he has been in the bed

## 2014-02-20 ENCOUNTER — Emergency Department (HOSPITAL_COMMUNITY): Payer: Self-pay

## 2014-02-20 MED ORDER — DIAZEPAM 5 MG PO TABS
10.0000 mg | ORAL_TABLET | Freq: Once | ORAL | Status: AC
  Administered 2014-02-20: 10 mg via ORAL
  Filled 2014-02-20: qty 2

## 2014-02-20 MED ORDER — DIAZEPAM 10 MG PO TABS: 10.0000 mg | ORAL_TABLET | Freq: Two times a day (BID) | ORAL | Status: DC | PRN

## 2014-02-20 MED ORDER — OXYCODONE-ACETAMINOPHEN 5-325 MG PO TABS
2.0000 | ORAL_TABLET | Freq: Once | ORAL | Status: AC
  Administered 2014-02-20: 2 via ORAL
  Filled 2014-02-20: qty 2

## 2014-02-20 MED ORDER — OXYCODONE-ACETAMINOPHEN 5-325 MG PO TABS: 2.0000 | ORAL_TABLET | Freq: Two times a day (BID) | ORAL | Status: DC | PRN

## 2014-02-20 NOTE — Discharge Instructions (Signed)
Muscle Strain Brett Herrera, you were seen today for a muscle strain.  You need to follow up with orthopaedic surgery and possibly get an MRI to discover what the injury is.  Your xrays showed bone spurs of your hip on both sides.  Continue to take pain medication as needed and return to the ED for any significant worsening.  Thank you. A muscle strain (pulled muscle) happens when a muscle is stretched beyond normal length. It happens when a sudden, violent force stretches your muscle too far. Usually, a few of the fibers in your muscle are torn. Muscle strain is common in athletes. Recovery usually takes 1-2 weeks. Complete healing takes 5-6 weeks.  HOME CARE   Follow the PRICE method of treatment to help your injury get better. Do this the first 2-3 days after the injury:  Protect. Protect the muscle to keep it from getting injured again.  Rest. Limit your activity and rest the injured body part.  Ice. Put ice in a plastic bag. Place a towel between your skin and the bag. Then, apply the ice and leave it on from 15-20 minutes each hour. After the third day, switch to moist heat packs.  Compression. Use a splint or elastic bandage on the injured area for comfort. Do not put it on too tightly.  Elevate. Keep the injured body part above the level of your heart.  Only take medicine as told by your doctor.  Warm up before doing exercise to prevent future muscle strains. GET HELP IF:   You have more pain or puffiness (swelling) in the injured area.  You feel numbness, tingling, or notice a loss of strength in the injured area. MAKE SURE YOU:   Understand these instructions.  Will watch your condition.  Will get help right away if you are not doing well or get worse. Document Released: 03/15/2008 Document Revised: 03/27/2013 Document Reviewed: 01/03/2013 [facility] Patient Information 2015 [place], [place]. This information is not intended to replace advice given to you by your health care  provider. Make sure you discuss any questions you have with your health care provider.
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
## 2014-02-20 NOTE — ED Notes (Signed)
Patient transported to X-ray 

## 2014-05-01 ENCOUNTER — Emergency Department (HOSPITAL_BASED_OUTPATIENT_CLINIC_OR_DEPARTMENT_OTHER)
Admission: EM | Admit: 2014-05-01 | Discharge: 2014-05-01 | Disposition: A | Discharge status: Home / Self Care | Payer: Self-pay | Attending: Emergency Medicine | Admitting: Emergency Medicine

## 2014-05-01 ENCOUNTER — Emergency Department (HOSPITAL_BASED_OUTPATIENT_CLINIC_OR_DEPARTMENT_OTHER): Payer: Self-pay

## 2014-05-01 ENCOUNTER — Encounter (HOSPITAL_BASED_OUTPATIENT_CLINIC_OR_DEPARTMENT_OTHER): Payer: Self-pay | Admitting: Emergency Medicine

## 2014-05-01 DIAGNOSIS — M542 Cervicalgia: Secondary | ICD-10-CM | POA: Insufficient documentation

## 2014-05-01 DIAGNOSIS — Z9889 Other specified postprocedural states: Secondary | ICD-10-CM | POA: Insufficient documentation

## 2014-05-01 DIAGNOSIS — M545 Low back pain: Secondary | ICD-10-CM | POA: Insufficient documentation

## 2014-05-01 DIAGNOSIS — Z79899 Other long term (current) drug therapy: Secondary | ICD-10-CM | POA: Insufficient documentation

## 2014-05-01 DIAGNOSIS — G8929 Other chronic pain: Secondary | ICD-10-CM | POA: Insufficient documentation

## 2014-05-01 DIAGNOSIS — Z8719 Personal history of other diseases of the digestive system: Secondary | ICD-10-CM | POA: Insufficient documentation

## 2014-05-01 MED ORDER — OXYCODONE-ACETAMINOPHEN 5-325 MG PO TABS
2.0000 | ORAL_TABLET | Freq: Once | ORAL | Status: AC
  Administered 2014-05-01: 2 via ORAL
  Filled 2014-05-01: qty 2

## 2014-05-01 MED ORDER — KETOROLAC TROMETHAMINE 60 MG/2ML IM SOLN
60.0000 mg | Freq: Once | INTRAMUSCULAR | Status: AC
  Administered 2014-05-01: 60 mg via INTRAMUSCULAR
  Filled 2014-05-01: qty 2

## 2014-05-01 MED ORDER — OXYCODONE-ACETAMINOPHEN 5-325 MG PO TABS: 1.0000 | ORAL_TABLET | Freq: Four times a day (QID) | ORAL | Status: DC | PRN

## 2014-05-01 NOTE — Discharge Instructions (Signed)
Ibuprofen 600 mg 3 times daily for the next 5 days.  Percocet as prescribed for pain not relieved with ibuprofen.  Follow-up with your primary Dr. If not improving in the next week, and return to the ER if your symptoms substantially worsen or change.   Back Pain, Adult Low back pain is very common. About 1 in 5 people have back pain.The cause of low back pain is rarely dangerous. The pain often gets better over time.About half of people with a sudden onset of back pain feel better in just 2 weeks. About 8 in 10 people feel better by 6 weeks.  CAUSES Some common causes of back pain include:  Strain of the muscles or ligaments supporting the spine.  Wear and tear (degeneration) of the spinal discs.  Arthritis.  Direct injury to the back. DIAGNOSIS Most of the time, the direct cause of low back pain is not known.However, back pain can be treated effectively even when the exact cause of the pain is unknown.Answering your caregiver's questions about your overall health and symptoms is one of the most accurate ways to make sure the cause of your pain is not dangerous. If your caregiver needs more information, he or she may order lab work or imaging tests (X-rays or MRIs).However, even if imaging tests show changes in your back, this usually does not require surgery. HOME CARE INSTRUCTIONS For many people, back pain returns.Since low back pain is rarely dangerous, it is often a condition that people can learn to [facility] their own.   Remain active. It is stressful on the back to sit or stand in one place. Do not sit, drive, or stand in one place for more than 30 minutes at a time. Take short walks on level surfaces as soon as pain allows.Try to increase the length of time you walk each day.  Do not stay in bed.Resting more than 1 or 2 days can delay your recovery.  Do not avoid exercise or work.Your body is made to move.It is not dangerous to be active, even though your back may  hurt.Your back will likely heal faster if you return to being active before your pain is gone.  Pay attention to your body when you bend and lift. Many people have less discomfortwhen lifting if they bend their knees, keep the load close to their bodies,and avoid twisting. Often, the most comfortable positions are those that put less stress on your recovering back.  Find a comfortable position to sleep. Use a firm mattress and lie on your side with your knees slightly bent. If you lie on your back, put a pillow under your knees.  Only take over-the-counter or prescription medicines as directed by your caregiver. Over-the-counter medicines to reduce pain and inflammation are often the most helpful.Your caregiver may prescribe muscle relaxant drugs.These medicines help dull your pain so you can more quickly return to your normal activities and healthy exercise.  Put ice on the injured area.  Put ice in a plastic bag.  Place a towel between your skin and the bag.  Leave the ice on for 15-20 minutes, 03-04 times a day for the first 2 to 3 days. After that, ice and heat may be alternated to reduce pain and spasms.  Ask your caregiver about trying back exercises and gentle massage. This may be of some benefit.  Avoid feeling anxious or stressed.Stress increases muscle tension and can worsen back pain.It is important to recognize when you are anxious or stressed and learn  ways to manage it.Exercise is a great option. SEEK MEDICAL CARE IF:  You have pain that is not relieved with rest or medicine.  You have pain that does not improve in 1 week.  You have new symptoms.  You are generally not feeling well. SEEK IMMEDIATE MEDICAL CARE IF:   You have pain that radiates from your back into your legs.  You develop new bowel or bladder control problems.  You have unusual weakness or numbness in your arms or legs.  You develop nausea or vomiting.  You develop abdominal pain.  You  feel faint. Document Released: 06/06/2005 Document Revised: 12/06/2011 Document Reviewed: 10/08/2013 [facility] Patient Information 2015 [place], [place]. This information is not intended to replace advice given to you by your health care provider. Make sure you discuss any questions you have with your health care provider.  Cervical Sprain A cervical sprain is an injury in the neck in which the strong, fibrous tissues (ligaments) that connect your neck bones stretch or tear. Cervical sprains can range from mild to severe. Severe cervical sprains can cause the neck vertebrae to be unstable. This can lead to damage of the spinal cord and can result in serious nervous system problems. The amount of time it takes for a cervical sprain to get better depends on the cause and extent of the injury. Most cervical sprains heal in 1 to 3 weeks. CAUSES  Severe cervical sprains may be caused by:   Contact sport injuries (such as from football, rugby, wrestling, hockey, auto racing, gymnastics, diving, martial arts, or boxing).   Motor vehicle collisions.   Whiplash injuries. This is an injury from a sudden forward and backward whipping movement of the head and neck.  Falls.  Mild cervical sprains may be caused by:   Being in an awkward position, such as while cradling a telephone between your ear and shoulder.   Sitting in a chair that does not offer proper support.   Working at a poorly Marketing executivedesigned computer station.   Looking up or down for long periods of time.  SYMPTOMS   Pain, soreness, stiffness, or a burning sensation in the front, back, or sides of the neck. This discomfort may develop immediately after the injury or slowly, 24 hours or more after the injury.   Pain or tenderness directly in the middle of the back of the neck.   Shoulder or upper back pain.   Limited ability to move the neck.   Headache.   Dizziness.   Weakness, numbness, or tingling in the hands or arms.    Muscle spasms.   Difficulty swallowing or chewing.   Tenderness and swelling of the neck.  DIAGNOSIS  Most of the time your health care provider can diagnose a cervical sprain by taking your history and doing a physical exam. Your health care provider will ask about previous neck injuries and any known neck problems, such as arthritis in the neck. X-rays may be taken to find out if there are any other problems, such as with the bones of the neck. Other tests, such as a CT scan or MRI, may also be needed.  TREATMENT  Treatment depends on the severity of the cervical sprain. Mild sprains can be treated with rest, keeping the neck in place (immobilization), and pain medicines. Severe cervical sprains are immediately immobilized. Further treatment is done to help with pain, muscle spasms, and other symptoms and may include:  Medicines, such as pain relievers, numbing medicines, or muscle relaxants.  Physical therapy. This may involve stretching exercises, strengthening exercises, and posture training. Exercises and improved posture can help stabilize the neck, strengthen muscles, and help stop symptoms from returning.  HOME CARE INSTRUCTIONS   Put ice on the injured area.   Put ice in a plastic bag.   Place a towel between your skin and the bag.   Leave the ice on for 15-20 minutes, 3-4 times a day.   If your injury was severe, you may have been given a cervical collar to wear. A cervical collar is a two-piece collar designed to keep your neck from moving while it heals.  Do not remove the collar unless instructed by your health care provider.  If you have long hair, keep it outside of the collar.  Ask your health care provider before making any adjustments to your collar. Minor adjustments may be required over time to improve comfort and reduce pressure on your chin or on the back of your head.  Ifyou are allowed to remove the collar for cleaning or bathing, follow your  health care provider's instructions on how to do so safely.  Keep your collar clean by wiping it with mild soap and water and drying it completely. If the collar you have been given includes removable pads, remove them every 1-2 days and hand wash them with soap and water. Allow them to air dry. They should be completely dry before you wear them in the collar.  If you are allowed to remove the collar for cleaning and bathing, wash and dry the skin of your neck. Check your skin for irritation or sores. If you see any, tell your health care provider.  Do not drive while wearing the collar.   Only take over-the-counter or prescription medicines for pain, discomfort, or fever as directed by your health care provider.   Keep all follow-up appointments as directed by your health care provider.   Keep all physical therapy appointments as directed by your health care provider.   Make any needed adjustments to your workstation to promote good posture.   Avoid positions and activities that make your symptoms worse.   Warm up and stretch before being active to help prevent problems.  SEEK MEDICAL CARE IF:   Your pain is not controlled with medicine.   You are unable to decrease your pain medicine over time as planned.   Your activity level is not improving as expected.  SEEK IMMEDIATE MEDICAL CARE IF:   You develop any bleeding.  You develop stomach upset.  You have signs of an allergic reaction to your medicine.   Your symptoms get worse.   You develop new, unexplained symptoms.   You have numbness, tingling, weakness, or paralysis in any part of your body.  MAKE SURE YOU:   Understand these instructions.  Will watch your condition.  Will get help right away if you are not doing well or get worse. Document Released: 04/03/2007 Document Revised: 06/11/2013 Document Reviewed: 12/12/2012 [facility] Patient Information 2015 [place], [place]. This information is not  intended to replace advice given to you by your health care provider. Make sure you discuss any questions you have with your health care provider.
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
## 2014-05-01 NOTE — ED Notes (Signed)
Pt weight training 2 days ago as normal except for this occurrence pt has developed lower and upper back pain

## 2014-06-23 ENCOUNTER — Emergency Department (HOSPITAL_BASED_OUTPATIENT_CLINIC_OR_DEPARTMENT_OTHER): Payer: Self-pay

## 2014-06-23 ENCOUNTER — Encounter (HOSPITAL_BASED_OUTPATIENT_CLINIC_OR_DEPARTMENT_OTHER): Payer: Self-pay | Admitting: *Deleted

## 2014-06-23 DIAGNOSIS — G8929 Other chronic pain: Secondary | ICD-10-CM | POA: Insufficient documentation

## 2014-06-23 DIAGNOSIS — Z79899 Other long term (current) drug therapy: Secondary | ICD-10-CM | POA: Insufficient documentation

## 2014-06-23 DIAGNOSIS — J209 Acute bronchitis, unspecified: Secondary | ICD-10-CM | POA: Insufficient documentation

## 2014-06-23 DIAGNOSIS — Z8719 Personal history of other diseases of the digestive system: Secondary | ICD-10-CM | POA: Insufficient documentation

## 2014-06-23 NOTE — ED Notes (Signed)
Pt reports productive cough, fever, head congestion x 1 week.

## 2014-06-24 ENCOUNTER — Emergency Department (HOSPITAL_BASED_OUTPATIENT_CLINIC_OR_DEPARTMENT_OTHER)
Admission: EM | Admit: 2014-06-24 | Discharge: 2014-06-24 | Disposition: A | Discharge status: Home / Self Care | Payer: Self-pay | Attending: Emergency Medicine | Admitting: Emergency Medicine

## 2014-06-24 DIAGNOSIS — J209 Acute bronchitis, unspecified: Secondary | ICD-10-CM

## 2014-06-24 DIAGNOSIS — R059 Cough, unspecified: Secondary | ICD-10-CM

## 2014-06-24 DIAGNOSIS — R05 Cough: Secondary | ICD-10-CM

## 2014-06-24 MED ORDER — HYDROCOD POLST-CHLORPHEN POLST 10-8 MG/5ML PO LQCR: 5.0000 mL | Freq: Two times a day (BID) | ORAL | Status: DC | PRN

## 2014-06-24 MED ORDER — IPRATROPIUM BROMIDE 0.02 % IN SOLN
0.5000 mg | Freq: Once | RESPIRATORY_TRACT | Status: AC
  Administered 2014-06-24: 0.5 mg via RESPIRATORY_TRACT
  Filled 2014-06-24: qty 2.5

## 2014-06-24 MED ORDER — ALBUTEROL SULFATE HFA 108 (90 BASE) MCG/ACT IN AERS
2.0000 | INHALATION_SPRAY | RESPIRATORY_TRACT | Status: DC | PRN
  Administered 2014-06-24: 2 via RESPIRATORY_TRACT
  Filled 2014-06-24: qty 6.7

## 2014-06-24 MED ORDER — ALBUTEROL SULFATE (2.5 MG/3ML) 0.083% IN NEBU
5.0000 mg | INHALATION_SOLUTION | Freq: Once | RESPIRATORY_TRACT | Status: AC
  Administered 2014-06-24: 5 mg via RESPIRATORY_TRACT
  Filled 2014-06-24: qty 6

## 2014-06-24 MED ORDER — HYDROCOD POLST-CHLORPHEN POLST 10-8 MG/5ML PO LQCR
5.0000 mL | Freq: Once | ORAL | Status: AC
Start: 2014-06-24 — End: 2014-06-24
  Administered 2014-06-24: 5 mL via ORAL
  Filled 2014-06-24: qty 5

## 2014-06-24 NOTE — ED Notes (Signed)
Pt c/o head and chest congestion, cough, eyes watering and generalized body aches x 1 week  Worse last 2 days

## 2014-06-24 NOTE — Discharge Instructions (Signed)

## 2014-06-24 NOTE — ED Provider Notes (Signed)
CSN: [id number]     Arrival date & time 06/23/14  2254 History   First MD Initiated Contact with Patient 06/24/14 0101     Chief Complaint  Patient presents with  . URI     (Consider location/radiation/quality/duration/timing/severity/associated sxs/prior Treatment) HPI  This is a 39 year old male with a one-week history of respiratory symptoms. Specifically he has had cough, shortness of breath, nasal congestion, sinus pressure, scratchy throat, hoarse voice, fever as high as 102 and nausea. He denies vomiting or diarrhea. Symptoms of worsened over the past 2 days and they are severe enough to keep him from sleeping at night. He denies dyspnea on exertion. He has been taking NyQuil and Mucinex without relief.  Past Medical History  Diagnosis Date  . Pancreatitis   . Chronic back pain    Past Surgical History  Procedure Laterality Date  . Orthopedic surgery     History reviewed. No pertinent family history. History  Substance Use Topics  . Smoking status: Never Smoker   . Smokeless tobacco: Not on file  . Alcohol Use: No    Review of Systems  All other systems reviewed and are negative.   Allergies  Review of patient's allergies indicates no known allergies.  Home Medications   Prior to Admission medications   Medication Sig Start Date End Date Taking? Authorizing Provider  OVER THE COUNTER MEDICATION Take 1 Package by mouth daily. Sport multi pack vitamins    Historical Provider, MD   BP 182/100 mmHg  Pulse 76  Temp(Src) 98.7 F (37.1 C) (Oral)  Resp 18  Ht 6\' 3"  (1.905 m)  Wt 230 lb (104.327 kg)  BMI 28.75 kg/m2  SpO2 99%   Physical Exam General: Well-developed, well-nourished male in no acute distress; appearance consistent with age of record HENT: normocephalic; atraumatic; pharynx normal; mild dysphonia; partial nasal congestion Eyes: pupils equal, round and reactive to light; extraocular muscles intact Neck: supple Heart: regular rate and rhythm Lungs:  East air movement bilaterally Abdomen: soft; nondistended; nontender; no masses or hepatosplenomegaly; bowel sounds present Extremities: No deformity; full range of motion; pulses normal Neurologic: Awake, alert and oriented; motor function intact in all extremities and symmetric; no facial droop Skin: Warm and dry Psychiatric: Flat affect   ED Course  Procedures (including critical care time)   MDM  Nursing notes and vitals signs, including pulse oximetry, reviewed.  Summary of this visit's results, reviewed by myself:  Imaging Studies: Dg Chest 2 View  06/23/2014   CLINICAL DATA:  Cough, congestion, chest pain, fever.  EXAM: CHEST  2 VIEW  COMPARISON:  03/06/2012  FINDINGS: Normal heart size and pulmonary vascularity. No focal airspace disease or consolidation in the lungs. No blunting of costophrenic angles. No pneumothorax. Mediastinal contours appear intact. Old right rib fractures.  IMPRESSION: No active cardiopulmonary disease.   Electronically Signed   By: Burman NievesWilliam  Stevens M.D.   On: 06/23/2014 23:21   3:27 AM Air movement improved after neb treatment. We'll provide an inhaler. He has are a taking over-the-counter medications for nasal congestion.    Hanley SeamenJohn L Massiah Minjares, MD 06/24/14 [phone]

## 2014-07-06 ENCOUNTER — Encounter (HOSPITAL_BASED_OUTPATIENT_CLINIC_OR_DEPARTMENT_OTHER): Payer: Self-pay | Admitting: *Deleted

## 2014-07-06 ENCOUNTER — Emergency Department (HOSPITAL_BASED_OUTPATIENT_CLINIC_OR_DEPARTMENT_OTHER)
Admission: EM | Admit: 2014-07-06 | Discharge: 2014-07-06 | Disposition: A | Discharge status: Home / Self Care | Payer: Self-pay | Attending: Emergency Medicine | Admitting: Emergency Medicine

## 2014-07-06 DIAGNOSIS — G8929 Other chronic pain: Secondary | ICD-10-CM | POA: Insufficient documentation

## 2014-07-06 DIAGNOSIS — J019 Acute sinusitis, unspecified: Secondary | ICD-10-CM | POA: Insufficient documentation

## 2014-07-06 DIAGNOSIS — I10 Essential (primary) hypertension: Secondary | ICD-10-CM | POA: Insufficient documentation

## 2014-07-06 DIAGNOSIS — Z8719 Personal history of other diseases of the digestive system: Secondary | ICD-10-CM | POA: Insufficient documentation

## 2014-07-06 DIAGNOSIS — Z79899 Other long term (current) drug therapy: Secondary | ICD-10-CM | POA: Insufficient documentation

## 2014-07-06 HISTORY — DX: Essential (primary) hypertension: I10

## 2014-07-06 MED ORDER — AMOXICILLIN-POT CLAVULANATE 875-125 MG PO TABS: 1.0000 | ORAL_TABLET | Freq: Two times a day (BID) | ORAL | Status: DC

## 2014-07-06 MED ORDER — ALIGN PO CAPS: ORAL_CAPSULE | ORAL | Status: DC

## 2014-07-06 MED ORDER — AMOXICILLIN-POT CLAVULANATE 875-125 MG PO TABS
1.0000 | ORAL_TABLET | Freq: Once | ORAL | Status: AC
  Administered 2014-07-06: 1 via ORAL
  Filled 2014-07-06: qty 1

## 2014-07-06 NOTE — ED Provider Notes (Signed)
CSN: [id number]     Arrival date & time 07/06/14  0045 History   First MD Initiated Contact with Patient 07/06/14 [phone]     Chief Complaint  Patient presents with  . Fever     (Consider location/radiation/quality/duration/timing/severity/associated sxs/prior Treatment) HPI  This is a 39 year old male who was seen by myself on January 6 fifth for complaints of an upper respiratory infection. At that time he related a one-week history of cough, shortness of breath, nasal congestion, sinus pressure, scratchy throat, hoarse voice and fever as high as 102. He also complained of nausea but denied vomiting or diarrhea at that time. He complained that the symptoms have worsened over the previous 2 days and were severe enough to keep him from sleeping at night. He denied dyspnea on exertion. He had been taking NyQuil and Mucinex without relief.  He got relief with albuterol and Atrovent and was discharged with an albuterol inhaler and a prescription for Tussionex.   He returns stating his chest symptoms (cough, shortness of breath) have improved significantly. He is still having body aches, sinus pain and congestion. He is also having postnasal drip, intermittent sore throat and fever to 102.2. He has had diarrhea but no nausea or vomiting.  Past Medical History  Diagnosis Date  . Pancreatitis   . Chronic back pain   . Hypertension    Past Surgical History  Procedure Laterality Date  . Orthopedic surgery     No family history on file. History  Substance Use Topics  . Smoking status: Never Smoker   . Smokeless tobacco: Not on file  . Alcohol Use: No    Review of Systems  All other systems reviewed and are negative.   Allergies  Review of patient's allergies indicates no known allergies.  Home Medications   Prior to Admission medications   Medication Sig Start Date End Date Taking? Authorizing Provider  albuterol (PROVENTIL HFA;VENTOLIN HFA) 108 (90 BASE) MCG/ACT inhaler Inhale  into the lungs every 6 (six) hours as needed for wheezing or shortness of breath.   Yes Historical Provider, MD  chlorpheniramine-HYDROcodone (TUSSIONEX PENNKINETIC ER) 10-8 MG/5ML LQCR Take 5 mLs by mouth every 12 (twelve) hours as needed. 06/24/14   Carlisle BeersJohn L Deshawn Witty, MD  OVER THE COUNTER MEDICATION Take 1 Package by mouth daily. Sport multi pack vitamins    Historical Provider, MD   BP 164/104 mmHg  Pulse 86  Temp(Src) 98.9 F (37.2 C) (Oral)  Resp 18  Ht 6\' 3"  (1.905 m)  Wt 225 lb (102.059 kg)  BMI 28.12 kg/m2  SpO2 99%   Physical Exam  General: Well-developed, well-nourished male in no acute distress; appearance consistent with age of record HENT: normocephalic; atraumatic; TMs without erythema; normal-appearing pharynx; nasal congestion; sinuses tender to percussion; partial nasal congestion Eyes: pupils equal, round and reactive to light; extraocular muscles intact Neck: supple Heart: regular rate and rhythm Lungs: clear to auscultation bilaterally Abdomen: soft; nondistended; nontender Extremities: No deformity; full range of motion Neurologic: Awake, alert and oriented; motor function intact in all extremities and symmetric; no facial droop Skin: Warm and dry Psychiatric: Flat affect    ED Course  Procedures (including critical care time)   MDM  Patient symptoms send her around his sinuses. His symptoms of been present long enough to consider antibiotic treatment for sinusitis.   Hanley SeamenJohn L Meloney Feld, MD 07/06/14 [phone]

## 2014-07-06 NOTE — ED Notes (Signed)
Dr. Molpus at BS 

## 2014-07-06 NOTE — ED Notes (Signed)
States, "feel like I have a sinus infection and the flu". Was treated last week for bronchitis and given tussionex and inhaler. No abx given. C/o HA, body aches, sinus congestion, intermittent sore throat, cough, congestion and fever. LS CTA. Dry cough noted.

## 2014-07-06 NOTE — Discharge Instructions (Signed)

## 2014-07-06 NOTE — ED Notes (Signed)
Reports he was seen here 1 week ago and dx with bronchitis and given inhaler- reports he has fever, body aches, facial pain- temp today pta 102 per pt report

## 2014-08-04 ENCOUNTER — Emergency Department (HOSPITAL_BASED_OUTPATIENT_CLINIC_OR_DEPARTMENT_OTHER)
Admission: EM | Admit: 2014-08-04 | Discharge: 2014-08-04 | Disposition: A | Discharge status: Home / Self Care | Payer: Self-pay | Attending: Emergency Medicine | Admitting: Emergency Medicine

## 2014-08-04 ENCOUNTER — Emergency Department (HOSPITAL_BASED_OUTPATIENT_CLINIC_OR_DEPARTMENT_OTHER): Payer: Self-pay

## 2014-08-04 ENCOUNTER — Encounter (HOSPITAL_BASED_OUTPATIENT_CLINIC_OR_DEPARTMENT_OTHER): Payer: Self-pay | Admitting: *Deleted

## 2014-08-04 DIAGNOSIS — M545 Low back pain, unspecified: Secondary | ICD-10-CM

## 2014-08-04 DIAGNOSIS — Z79899 Other long term (current) drug therapy: Secondary | ICD-10-CM | POA: Insufficient documentation

## 2014-08-04 DIAGNOSIS — G8929 Other chronic pain: Secondary | ICD-10-CM | POA: Insufficient documentation

## 2014-08-04 DIAGNOSIS — Y998 Other external cause status: Secondary | ICD-10-CM | POA: Insufficient documentation

## 2014-08-04 DIAGNOSIS — I10 Essential (primary) hypertension: Secondary | ICD-10-CM | POA: Insufficient documentation

## 2014-08-04 DIAGNOSIS — S199XXA Unspecified injury of neck, initial encounter: Secondary | ICD-10-CM | POA: Insufficient documentation

## 2014-08-04 DIAGNOSIS — W1839XA Other fall on same level, initial encounter: Secondary | ICD-10-CM | POA: Insufficient documentation

## 2014-08-04 DIAGNOSIS — S3992XA Unspecified injury of lower back, initial encounter: Secondary | ICD-10-CM | POA: Insufficient documentation

## 2014-08-04 DIAGNOSIS — W19XXXA Unspecified fall, initial encounter: Secondary | ICD-10-CM

## 2014-08-04 DIAGNOSIS — Y9289 Other specified places as the place of occurrence of the external cause: Secondary | ICD-10-CM | POA: Insufficient documentation

## 2014-08-04 DIAGNOSIS — S299XXA Unspecified injury of thorax, initial encounter: Secondary | ICD-10-CM | POA: Insufficient documentation

## 2014-08-04 DIAGNOSIS — Y9389 Activity, other specified: Secondary | ICD-10-CM | POA: Insufficient documentation

## 2014-08-04 MED ORDER — CYCLOBENZAPRINE HCL 10 MG PO TABS: 10.0000 mg | ORAL_TABLET | Freq: Two times a day (BID) | ORAL | Status: DC | PRN

## 2014-08-04 MED ORDER — OXYCODONE-ACETAMINOPHEN 5-325 MG PO TABS
2.0000 | ORAL_TABLET | Freq: Once | ORAL | Status: AC
  Administered 2014-08-04: 2 via ORAL
  Filled 2014-08-04: qty 2

## 2014-08-04 MED ORDER — IBUPROFEN 800 MG PO TABS
800.0000 mg | ORAL_TABLET | Freq: Once | ORAL | Status: AC
  Administered 2014-08-04: 800 mg via ORAL
  Filled 2014-08-04: qty 1

## 2014-08-04 MED ORDER — CYCLOBENZAPRINE HCL 10 MG PO TABS
5.0000 mg | ORAL_TABLET | Freq: Once | ORAL | Status: AC
  Administered 2014-08-04: 5 mg via ORAL
  Filled 2014-08-04: qty 1

## 2014-08-04 MED ORDER — OXYCODONE-ACETAMINOPHEN 5-325 MG PO TABS: 1.0000 | ORAL_TABLET | Freq: Four times a day (QID) | ORAL | Status: DC | PRN

## 2014-08-04 MED ORDER — IBUPROFEN 600 MG PO TABS: 600.0000 mg | ORAL_TABLET | Freq: Four times a day (QID) | ORAL | Status: DC | PRN
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
## 2014-08-04 NOTE — Discharge Instructions (Signed)
Fall Prevention and Home Safety °Falls cause injuries and can affect all age groups. It is possible to use preventive measures to significantly decrease the likelihood of falls. There are many simple measures which can make your home safer and prevent falls. °OUTDOORS °· Repair cracks and edges of walkways and driveways. °· Remove high doorway thresholds. °· Trim shrubbery on the main path into your home. °· Have good outside lighting. °· Clear walkways of tools, rocks, debris, and clutter. °· Check that handrails are not broken and are securely fastened. Both sides of steps should have handrails. °· Have leaves, snow, and ice cleared regularly. °· Use sand or salt on walkways during winter months. °· In the garage, clean up grease or oil spills. °BATHROOM °· Install night lights. °· Install grab bars by the toilet and in the tub and shower. °· Use non-skid mats or decals in the tub or shower. °· Place a plastic non-slip stool in the shower to sit on, if needed. °· Keep floors dry and clean up all water on the floor immediately. °· Remove soap buildup in the tub or shower on a regular basis. °· Secure bath mats with non-slip, double-sided rug tape. °· Remove throw rugs and tripping hazards from the floors. °BEDROOMS °· Install night lights. °· Make sure a bedside light is easy to reach. °· Do not use oversized bedding. °· Keep a telephone by your bedside. °· Have a firm chair with side arms to use for getting dressed. °· Remove throw rugs and tripping hazards from the floor. °KITCHEN °· Keep handles on pots and pans turned toward the center of the stove. Use back burners when possible. °· Clean up spills quickly and allow time for drying. °· Avoid walking on wet floors. °· Avoid hot utensils and knives. °· Position shelves so they are not too high or low. °· Place commonly used objects within easy reach. °· If necessary, use a sturdy step stool with a grab bar when reaching. °· Keep electrical cables out of the  way. °· Do not use floor polish or wax that makes floors slippery. If you must use wax, use non-skid floor wax. °· Remove throw rugs and tripping hazards from the floor. °STAIRWAYS °· Never leave objects on stairs. °· Place handrails on both sides of stairways and use them. Fix any loose handrails. Make sure handrails on both sides of the stairways are as long as the stairs. °· Check carpeting to make sure it is firmly attached along stairs. Make repairs to worn or loose carpet promptly. °· Avoid placing throw rugs at the top or bottom of stairways, or properly secure the rug with carpet tape to prevent slippage. Get rid of throw rugs, if possible. °· Have an electrician put in a light switch at the top and bottom of the stairs. °OTHER FALL PREVENTION TIPS °· Wear low-heel or rubber-soled shoes that are supportive and fit well. Wear closed toe shoes. °· When using a stepladder, make sure it is fully opened and both spreaders are firmly locked. Do not climb a closed stepladder. °· Add color or contrast paint or tape to grab bars and handrails in your home. Place contrasting color strips on first and last steps. °· Learn and use mobility aids as needed. Install an electrical emergency response system. °· Turn on lights to avoid dark areas. Replace light bulbs that burn out immediately. Get light switches that glow. °· Arrange furniture to create clear pathways. Keep furniture in the same place. °·   Firmly attach carpet with non-skid or double-sided tape. °· Eliminate uneven floor surfaces. °· Select a carpet pattern that does not visually hide the edge of steps. °· Be aware of all pets. °OTHER HOME SAFETY TIPS °· Set the water temperature for 120° F (48.8° C). °· Keep emergency numbers on or near the telephone. °· Keep smoke detectors on every level of the home and near sleeping areas. °Document Released: 05/27/2002 Document Revised: 12/06/2011 Document Reviewed: 08/26/2011 °[employer]® Patient Information ©2015  [employer]. This information is not intended to replace advice given to you by your health care provider. Make sure you discuss any questions you have with your health care provider. °Musculoskeletal Pain °Musculoskeletal pain is muscle and boney aches and pains. These pains can occur in any part of the body. Your caregiver may treat you without knowing the cause of the pain. They may treat you if blood or urine tests, X-rays, and other tests were normal.  °CAUSES °There is often not a definite cause or reason for these pains. These pains may be caused by a type of germ (virus). The discomfort may also come from overuse. Overuse includes working out too hard when your body is not fit. Boney aches also come from weather changes. Bone is sensitive to atmospheric pressure changes. °HOME CARE INSTRUCTIONS  °· Ask when your test results will be ready. Make sure you get your test results. °· Only take over-the-counter or prescription medicines for pain, discomfort, or fever as directed by your caregiver. If you were given medications for your condition, do not drive, operate machinery or power tools, or sign legal documents for 24 hours. Do not drink alcohol. Do not take sleeping pills or other medications that may interfere with treatment. °· Continue all activities unless the activities cause more pain. When the pain lessens, slowly resume normal activities. Gradually increase the intensity and duration of the activities or exercise. °· During periods of severe pain, bed rest may be helpful. Lay or sit in any position that is comfortable. °· Putting ice on the injured area. °¨ Put ice in a bag. °¨ Place a towel between your skin and the bag. °¨ Leave the ice on for 15 to 20 minutes, 3 to 4 times a day. °· Follow up with your caregiver for continued problems and no reason can be found for the pain. If the pain becomes worse or does not go away, it may be necessary to repeat tests or do additional testing. Your  caregiver may need to look further for a possible cause. °SEEK IMMEDIATE MEDICAL CARE IF: °· You have pain that is getting worse and is not relieved by medications. °· You develop chest pain that is associated with shortness or breath, sweating, feeling sick to your stomach (nauseous), or throw up (vomit). °· Your pain becomes localized to the abdomen. °· You develop any new symptoms that seem different or that concern you. °MAKE SURE YOU:  °· Understand these instructions. °· Will watch your condition. °· Will get help right away if you are not doing well or get worse. °Document Released: 06/06/2005 Document Revised: 08/29/2011 Document Reviewed: 02/08/2013 °[employer]® Patient Information ©2015 [employer]. This information is not intended to replace advice given to you by your health care provider. Make sure you discuss any questions you have with your health care provider. ° °

## 2014-08-04 NOTE — ED Notes (Signed)
Slipped on ice. Back pain.

## 2014-11-01 ENCOUNTER — Emergency Department (HOSPITAL_COMMUNITY): Payer: Self-pay

## 2014-11-01 ENCOUNTER — Encounter (HOSPITAL_COMMUNITY): Payer: Self-pay | Admitting: *Deleted

## 2014-11-01 ENCOUNTER — Emergency Department (HOSPITAL_COMMUNITY)
Admission: EM | Admit: 2014-11-01 | Discharge: 2014-11-01 | Disposition: A | Discharge status: Home / Self Care | Payer: Self-pay | Attending: Emergency Medicine | Admitting: Emergency Medicine

## 2014-11-01 DIAGNOSIS — Z792 Long term (current) use of antibiotics: Secondary | ICD-10-CM | POA: Insufficient documentation

## 2014-11-01 DIAGNOSIS — Z8719 Personal history of other diseases of the digestive system: Secondary | ICD-10-CM | POA: Insufficient documentation

## 2014-11-01 DIAGNOSIS — G8929 Other chronic pain: Secondary | ICD-10-CM | POA: Insufficient documentation

## 2014-11-01 DIAGNOSIS — R197 Diarrhea, unspecified: Secondary | ICD-10-CM | POA: Insufficient documentation

## 2014-11-01 DIAGNOSIS — Z79899 Other long term (current) drug therapy: Secondary | ICD-10-CM | POA: Insufficient documentation

## 2014-11-01 DIAGNOSIS — R05 Cough: Secondary | ICD-10-CM

## 2014-11-01 DIAGNOSIS — R059 Cough, unspecified: Secondary | ICD-10-CM

## 2014-11-01 DIAGNOSIS — J069 Acute upper respiratory infection, unspecified: Secondary | ICD-10-CM | POA: Insufficient documentation

## 2014-11-01 DIAGNOSIS — G4700 Insomnia, unspecified: Secondary | ICD-10-CM | POA: Insufficient documentation

## 2014-11-01 DIAGNOSIS — I10 Essential (primary) hypertension: Secondary | ICD-10-CM | POA: Insufficient documentation

## 2014-11-01 DIAGNOSIS — J111 Influenza due to unidentified influenza virus with other respiratory manifestations: Secondary | ICD-10-CM | POA: Insufficient documentation

## 2014-11-01 DIAGNOSIS — F419 Anxiety disorder, unspecified: Secondary | ICD-10-CM | POA: Insufficient documentation

## 2014-11-01 DIAGNOSIS — R1110 Vomiting, unspecified: Secondary | ICD-10-CM | POA: Insufficient documentation

## 2014-11-01 LAB — URINALYSIS, ROUTINE W REFLEX MICROSCOPIC
BILIRUBIN URINE: NEGATIVE
GLUCOSE, UA: NEGATIVE mg/dL
Hgb urine dipstick: NEGATIVE
Ketones, ur: NEGATIVE mg/dL
LEUKOCYTES UA: NEGATIVE
Nitrite: NEGATIVE
Protein, ur: NEGATIVE mg/dL
Specific Gravity, Urine: 1.019 (ref 1.005–1.030)
UROBILINOGEN UA: 1 mg/dL (ref 0.0–1.0)
pH: 5.5 (ref 5.0–8.0)

## 2014-11-01 LAB — CBC WITH DIFFERENTIAL/PLATELET
BASOS ABS: 0 10*3/uL (ref 0.0–0.1)
Basophils Relative: 0 % (ref 0–1)
EOS ABS: 0.1 10*3/uL (ref 0.0–0.7)
Eosinophils Relative: 1 % (ref 0–5)
HCT: 49.9 % (ref 39.0–52.0)
Hemoglobin: 17.6 g/dL — ABNORMAL HIGH (ref 13.0–17.0)
LYMPHS ABS: 1.6 10*3/uL (ref 0.7–4.0)
LYMPHS PCT: 13 % (ref 12–46)
MCH: 30.6 pg (ref 26.0–34.0)
MCHC: 35.3 g/dL (ref 30.0–36.0)
MCV: 86.6 fL (ref 78.0–100.0)
Monocytes Absolute: 1 10*3/uL (ref 0.1–1.0)
Monocytes Relative: 8 % (ref 3–12)
NEUTROS ABS: 10.1 10*3/uL — AB (ref 1.7–7.7)
NEUTROS PCT: 78 % — AB (ref 43–77)
Platelets: 411 10*3/uL — ABNORMAL HIGH (ref 150–400)
RBC: 5.76 MIL/uL (ref 4.22–5.81)
RDW: 14.2 % (ref 11.5–15.5)
WBC: 12.7 10*3/uL — ABNORMAL HIGH (ref 4.0–10.5)

## 2014-11-01 LAB — LIPASE, BLOOD: LIPASE: 71 U/L — AB (ref 22–51)

## 2014-11-01 LAB — COMPREHENSIVE METABOLIC PANEL
ALT: 80 U/L — ABNORMAL HIGH (ref 17–63)
ANION GAP: 9 (ref 5–15)
AST: 46 U/L — ABNORMAL HIGH (ref 15–41)
Albumin: 3.9 g/dL (ref 3.5–5.0)
Alkaline Phosphatase: 51 U/L (ref 38–126)
BUN: 8 mg/dL (ref 6–20)
CALCIUM: 9.3 mg/dL (ref 8.9–10.3)
CHLORIDE: 102 mmol/L (ref 101–111)
CO2: 25 mmol/L (ref 22–32)
Creatinine, Ser: 1.11 mg/dL (ref 0.61–1.24)
GFR calc Af Amer: >60 mL/min (ref >60)
GFR calc non Af Amer: >60 mL/min (ref >60)
Glucose, Bld: 109 mg/dL — ABNORMAL HIGH (ref 65–99)
POTASSIUM: 3.9 mmol/L (ref 3.5–5.1)
Sodium: 136 mmol/L (ref 135–145)
TOTAL PROTEIN: 7.3 g/dL (ref 6.5–8.1)
Total Bilirubin: 1.3 mg/dL — ABNORMAL HIGH (ref 0.3–1.2)

## 2014-11-01 MED ORDER — DIAZEPAM 5 MG/ML IJ SOLN
5.0000 mg | Freq: Once | INTRAMUSCULAR | Status: AC
  Administered 2014-11-01: 5 mg via INTRAVENOUS
  Filled 2014-11-01: qty 2

## 2014-11-01 MED ORDER — LORAZEPAM 2 MG/ML IJ SOLN
1.0000 mg | Freq: Once | INTRAMUSCULAR | Status: AC
  Administered 2014-11-01: 1 mg via INTRAVENOUS
  Filled 2014-11-01: qty 1

## 2014-11-01 MED ORDER — OSELTAMIVIR PHOSPHATE 75 MG PO CAPS: 75.0000 mg | ORAL_CAPSULE | Freq: Two times a day (BID) | ORAL | Status: DC

## 2014-11-01 MED ORDER — ALPRAZOLAM 0.5 MG PO TABS: 0.5000 mg | ORAL_TABLET | Freq: Three times a day (TID) | ORAL | Status: DC | PRN

## 2014-11-01 MED ORDER — ZOLPIDEM TARTRATE 5 MG PO TABS: 5.0000 mg | ORAL_TABLET | Freq: Every evening | ORAL | Status: DC | PRN

## 2014-11-01 MED ORDER — AMLODIPINE BESYLATE 5 MG PO TABS: 5.0000 mg | ORAL_TABLET | Freq: Every day | ORAL | Status: DC

## 2014-11-01 MED ORDER — SODIUM CHLORIDE 0.9 % IV BOLUS (SEPSIS)
1000.0000 mL | Freq: Once | INTRAVENOUS | Status: AC
  Administered 2014-11-01: 1000 mL via INTRAVENOUS

## 2014-11-01 NOTE — ED Notes (Signed)
Dr. Steinl at bedside 

## 2014-11-01 NOTE — Discharge Instructions (Signed)
It was our pleasure to provide your ER care today - we hope that you feel better.  Rest. Drink plenty of fluids. Eat balanced diet.  Take tamiflu as prescribed. May try over the counter cold/flu medication as need for symptom relief.  Your blood pressure is high today.  Limit salt intake.  Take blood pressure medication as prescribed. Follow up with primary care doctor for recheck in coming week.  You may take xanax as need for anxiety. No driving when taking.  You may take ambien as need for sleep. No driving when taking.  Do not take both ambien and xanax together, rather take either/or.   Follow up with primary care doctor in the next few days - see referral.  For behavioral health options, see resource guide provided.  Return to ER if worse, new symptoms, trouble breathing, severe anxiety/depression, other concern.  You were given sedating medication in the ER - no driving for the next 4 hours.    Hypertension Hypertension, commonly called high blood pressure, is when the force of blood pumping through your arteries is too strong. Your arteries are the blood vessels that carry blood from your heart throughout your body. A blood pressure reading consists of a higher number over a lower number, such as 110/72. The higher number (systolic) is the pressure inside your arteries when your heart pumps. The lower number (diastolic) is the pressure inside your arteries when your heart relaxes. Ideally you want your blood pressure below 120/80. Hypertension forces your heart to work harder to pump blood. Your arteries may become narrow or stiff. Having hypertension puts you at risk for heart disease, stroke, and other problems.  RISK FACTORS Some risk factors for high blood pressure are controllable. Others are not.  Risk factors you cannot control include:   Race. You may be at higher risk if you are African American.  Age. Risk increases with age.  Gender. Men are at higher risk than  women before age 41 years. After age 41, women are at higher risk than men. Risk factors you can control include:  Not getting enough exercise or physical activity.  Being overweight.  Getting too much fat, sugar, calories, or salt in your diet.  Drinking too much alcohol. SIGNS AND SYMPTOMS Hypertension does not usually cause signs or symptoms. Extremely high blood pressure (hypertensive crisis) may cause headache, anxiety, shortness of breath, and nosebleed. DIAGNOSIS  To check if you have hypertension, your health care provider will measure your blood pressure while you are seated, with your arm held at the level of your heart. It should be measured at least twice using the same arm. Certain conditions can cause a difference in blood pressure between your right and left arms. A blood pressure reading that is higher than normal on one occasion does not mean that you need treatment. If one blood pressure reading is high, ask your health care provider about having it checked again. TREATMENT  Treating high blood pressure includes making lifestyle changes and possibly taking medicine. Living a healthy lifestyle can help lower high blood pressure. You may need to change some of your habits. Lifestyle changes may include:  Following the DASH diet. This diet is high in fruits, vegetables, and whole grains. It is low in salt, red meat, and added sugars.  Getting at least 2 hours of brisk physical activity every week.  Losing weight if necessary.  Not smoking.  Limiting alcoholic beverages.  Learning ways to reduce stress. If lifestyle changes  are not enough to get your blood pressure under control, your health care provider may prescribe medicine. You may need to take more than one. Work closely with your health care provider to understand the risks and benefits. HOME CARE INSTRUCTIONS  Have your blood pressure rechecked as directed by your health care provider.   Take medicines only  as directed by your health care provider. Follow the directions carefully. Blood pressure medicines must be taken as prescribed. The medicine does not work as well when you skip doses. Skipping doses also puts you at risk for problems.   Do not smoke.   Monitor your blood pressure at home as directed by your health care provider. SEEK MEDICAL CARE IF:   You think you are having a reaction to medicines taken.  You have recurrent headaches or feel dizzy.  You have swelling in your ankles.  You have trouble with your vision. SEEK IMMEDIATE MEDICAL CARE IF:  You develop a severe headache or confusion.  You have unusual weakness, numbness, or feel faint.  You have severe chest or abdominal pain.  You vomit repeatedly.  You have trouble breathing. MAKE SURE YOU:   Understand these instructions.  Will watch your condition.  Will get help right away if you are not doing well or get worse. Document Released: 06/06/2005 Document Revised: 10/21/2013 Document Reviewed: 03/29/2013 [facility] Patient Information 2015 [place], [place]. This information is not intended to replace advice given to you by your health care provider. Make sure you discuss any questions you have with your health care provider.    Influenza Influenza ("the flu") is a viral infection of the respiratory tract. It occurs more often in winter months because people spend more time in close contact with one another. Influenza can make you feel very sick. Influenza easily spreads from person to person (contagious). CAUSES  Influenza is caused by a virus that infects the respiratory tract. You can catch the virus by breathing in droplets from an infected person's cough or sneeze. You can also catch the virus by touching something that was recently contaminated with the virus and then touching your mouth, nose, or eyes. RISKS AND COMPLICATIONS You may be at risk for a more severe case of influenza if you smoke cigarettes,  have diabetes, have chronic heart disease (such as heart failure) or lung disease (such as asthma), or if you have a weakened immune system. Elderly people and pregnant women are also at risk for more serious infections. The most common problem of influenza is a lung infection (pneumonia). Sometimes, this problem can require emergency medical care and may be life threatening. SIGNS AND SYMPTOMS  Symptoms typically last 4 to 10 days and may include:  Fever.  Chills.  Headache, body aches, and muscle aches.  Sore throat.  Chest discomfort and cough.  Poor appetite.  Weakness or feeling tired.  Dizziness.  Nausea or vomiting. DIAGNOSIS  Diagnosis of influenza is often made based on your history and a physical exam. A nose or throat swab test can be done to confirm the diagnosis. TREATMENT  In mild cases, influenza goes away on its own. Treatment is directed at relieving symptoms. For more severe cases, your health care provider may prescribe antiviral medicines to shorten the sickness. Antibiotic medicines are not effective because the infection is caused by a virus, not by bacteria. HOME CARE INSTRUCTIONS  Take medicines only as directed by your health care provider.  Use a cool mist humidifier to make breathing easier.  Get plenty of rest until your temperature returns to normal. This usually takes 3 to 4 days.  Drink enough fluid to keep your urine clear or pale yellow.  Cover yourmouth and nosewhen coughing or sneezing,and wash your handswellto prevent thevirusfrom spreading.  Stay homefromwork orschool untilthe fever is gonefor at least 36full day. PREVENTION  An annual influenza vaccination (flu shot) is the best way to avoid getting influenza. An annual flu shot is now routinely recommended for all adults in the [place] SEEK MEDICAL CARE IF:  You experiencechest pain, yourcough worsens,or you producemore mucus.  Youhave nausea,vomiting,  ordiarrhea.  Your fever returns or gets worse. SEEK IMMEDIATE MEDICAL CARE IF:  You havetrouble breathing, you become short of breath,or your skin ornails becomebluish.  You have severe painor stiffnessin the neck.  You develop a sudden headache, or pain in the face or ear.  You have nausea or vomiting that you cannot control. MAKE SURE YOU:   Understand these instructions.  Will watch your condition.  Will get help right away if you are not doing well or get worse. Document Released: 06/03/2000 Document Revised: 10/21/2013 Document Reviewed: 09/05/2011 [facility] Patient Information 2015 [place], [place]. This information is not intended to replace advice given to you by your health care provider. Make sure you discuss any questions you have with your health care provider.    Insomnia Insomnia is frequent trouble falling and/or staying asleep. Insomnia can be a long term problem or a short term problem. Both are common. Insomnia can be a short term problem when the wakefulness is related to a certain stress or worry. Long term insomnia is often related to ongoing stress during waking hours and/or poor sleeping habits. Overtime, sleep deprivation itself can make the problem worse. Every little thing feels more severe because you are overtired and your ability to cope is decreased. CAUSES   Stress, anxiety, and depression.  Poor sleeping habits.  Distractions such as TV in the bedroom.  Naps close to bedtime.  Engaging in emotionally charged conversations before bed.  Technical reading before sleep.  Alcohol and other sedatives. They may make the problem worse. They can hurt normal sleep patterns and normal dream activity.  Stimulants such as caffeine for several hours prior to bedtime.  Pain syndromes and shortness of breath can cause insomnia.  Exercise late at night.  Changing time zones may cause sleeping problems (jet lag). It is sometimes helpful to have  someone observe your sleeping patterns. They should look for periods of not breathing during the night (sleep apnea). They should also look to see how long those periods last. If you live alone or observers are uncertain, you can also be observed at a sleep clinic where your sleep patterns will be professionally monitored. Sleep apnea requires a checkup and treatment. Give your caregivers your medical history. Give your caregivers observations your family has made about your sleep.  SYMPTOMS   Not feeling rested in the morning.  Anxiety and restlessness at bedtime.  Difficulty falling and staying asleep. TREATMENT   Your caregiver may prescribe treatment for an underlying medical disorders. Your caregiver can give advice or help if you are using alcohol or other drugs for self-medication. Treatment of underlying problems will usually eliminate insomnia problems.  Medications can be prescribed for short time use. They are generally not recommended for lengthy use.  Over-the-counter sleep medicines are not recommended for lengthy use. They can be habit forming.  You can promote easier sleeping by making lifestyle changes  such as:  Using relaxation techniques that help with breathing and reduce muscle tension.  Exercising earlier in the day.  Changing your diet and the time of your last meal. No night time snacks.  Establish a regular time to go to bed.  Counseling can help with stressful problems and worry.  Soothing music and white noise may be helpful if there are background noises you cannot remove.  Stop tedious detailed work at least one hour before bedtime. HOME CARE INSTRUCTIONS   Keep a diary. Inform your caregiver about your progress. This includes any medication side effects. See your caregiver regularly. Take note of:  Times when you are asleep.  Times when you are awake during the night.  The quality of your sleep.  How you feel the next day. This information will  help your caregiver care for you.  Get out of bed if you are still awake after 15 minutes. Read or do some quiet activity. Keep the lights down. Wait until you feel sleepy and go back to bed.  Keep regular sleeping and waking hours. Avoid naps.  Exercise regularly.  Avoid distractions at bedtime. Distractions include watching television or engaging in any intense or detailed activity like attempting to balance the household checkbook.  Develop a bedtime ritual. Keep a familiar routine of bathing, brushing your teeth, climbing into bed at the same time each night, listening to soothing music. Routines increase the success of falling to sleep faster.  Use relaxation techniques. This can be using breathing and muscle tension release routines. It can also include visualizing peaceful scenes. You can also help control troubling or intruding thoughts by keeping your mind occupied with boring or repetitive thoughts like the old concept of counting sheep. You can make it more creative like imagining planting one beautiful flower after another in your backyard garden.  During your day, work to eliminate stress. When this is not possible use some of the previous suggestions to help reduce the anxiety that accompanies stressful situations. MAKE SURE YOU:   Understand these instructions.  Will watch your condition.  Will get help right away if you are not doing well or get worse. Document Released: 06/03/2000 Document Revised: 08/29/2011 Document Reviewed: 07/04/2007 [facility] Patient Information 2015 [place], [place]. This information is not intended to replace advice given to you by your health care provider. Make sure you discuss any questions you have with your health care provider.    Generalized Anxiety Disorder Generalized anxiety disorder (GAD) is a mental disorder. It interferes with life functions, including relationships, work, and school. GAD is different from normal anxiety, which  everyone experiences at some point in their lives in response to specific life events and activities. Normal anxiety actually helps [place] prepare for and get through these life events and activities. Normal anxiety goes away after the event or activity is over.  GAD causes anxiety that is not necessarily related to specific events or activities. It also causes excess anxiety in proportion to specific events or activities. The anxiety associated with GAD is also difficult to control. GAD can vary from mild to severe. People with severe GAD can have intense waves of anxiety with physical symptoms (panic attacks).  SYMPTOMS The anxiety and worry associated with GAD are difficult to control. This anxiety and worry are related to many life events and activities and also occur more days than not for 6 months or longer. People with GAD also have three or more of the following symptoms (one or more  in children):  Restlessness.   Fatigue.  Difficulty concentrating.   Irritability.  Muscle tension.  Difficulty sleeping or unsatisfying sleep. DIAGNOSIS GAD is diagnosed through an assessment by your health care provider. Your health care provider will ask you questions aboutyour mood,physical symptoms, and events in your life. Your health care provider may ask you about your medical history and use of alcohol or drugs, including prescription medicines. Your health care provider may also do a physical exam and blood tests. Certain medical conditions and the use of certain substances can cause symptoms similar to those associated with GAD. Your health care provider may refer you to a mental health specialist for further evaluation. TREATMENT The following therapies are usually used to treat GAD:   Medication. Antidepressant medication usually is prescribed for long-term daily control. Antianxiety medicines may be added in severe cases, especially when panic attacks occur.   Talk therapy (psychotherapy).  Certain types of talk therapy can be helpful in treating GAD by providing support, education, and guidance. A form of talk therapy called cognitive behavioral therapy can teach you healthy ways to think about and react to daily life events and activities.  Stress managementtechniques. These include yoga, meditation, and exercise and can be very helpful when they are practiced regularly. A mental health specialist can help determine which treatment is best for you. Some people see improvement with one therapy. However, other people require a combination of therapies. Document Released: 10/01/2012 Document Revised: 10/21/2013 Document Reviewed: 10/01/2012 [facility] Patient Information 2015 [place], [place]. This information is not intended to replace advice given to you by your health care provider. Make sure you discuss any questions you have with your health care provider.      Emergency Department Resource Guide 1) Find a Doctor and Pay Out of Pocket Although you won't have to find out who is covered by your insurance plan, it is a good idea to ask around and get recommendations. You will then need to call the office and see if the doctor you have chosen will accept you as a new patient and what types of options they offer for patients who are self-pay. Some doctors offer discounts or will set up payment plans for their patients who do not have insurance, but you will need to ask so you aren't surprised when you get to your appointment.  2) Contact Your Local Health Department Not all health departments have doctors that can see patients for sick visits, but many do, so it is worth a call to see if yours does. If you don't know where your local health department is, you can check in your phone book. The CDC also has a tool to help you locate your state's health department, and many state websites also have listings of all of their local health departments.  3) Find a Walk-in Clinic If your illness  is not likely to be very severe or complicated, you may want to try a walk in clinic. These are popping up all over the country in pharmacies, drugstores, and shopping centers. They're usually staffed by nurse practitioners or physician assistants that have been trained to treat common illnesses and complaints. They're usually fairly quick and inexpensive. However, if you have serious medical issues or chronic medical problems, these are probably not your best option.  No Primary Care Doctor: - Call Health Connect at  [phone] - they can help you locate a primary care doctor that  accepts your insurance, provides certain services, etc. - Physician Referral  Service- [phone]  Chronic Pain Problems: Organization         Address  Phone   Notes  [facility]  [phone] Patients need to be referred by their primary care doctor.   Medication Assistance: Organization         Address  Phone   Notes  [facility] Medication [facility] [address] [place] [place], [place] [address] [phone] --Must be a resident of [facility] -- Must have NO insurance coverage whatsoever (no Medicaid/ Medicare, etc.) -- The pt. MUST have a primary care doctor that directs their care regularly and follows them in the community   [facility]  [phone]   [employer]  [phone]    Agencies that provide inexpensive medical care: Organization         Address  Phone   Notes  [facility]  [phone]   [facility]    [phone]   [facility] [address] [place], [place] [address] [phone]   [facility] [address], [place] [phone]   [facility]    [phone]   [facility]    [phone]   [facility]  [address], [place] Phone:  [phone], Fax:  [phone] Hours of Operation:  9 am - 6  pm, M-F.  Also accepts Medicaid/Medicare and self-pay.  [facility]  [address], [place] Phone: [phone], Fax: [phone]. Hours of Operation:  8:30 am - 5:30 pm, M-F.  Also accepts Medicaid and self-pay.  [facility] [place] [address] [place] [address] [place] Phone: [phone]   [facility] [address] Natasha Bence [place], [place] [phone], Ext. [phone] Mondays & Thursdays: 7-9 AM.  First 15 patients are seen on a first come, first serve basis.    Medicaid-accepting [facility] Providers:  Organization         Address  Phone   Notes  [facility] [address], [place] [phone] Also accepts self-pay patients.  [facility] [address] Laurell Josephs [place], [place]  [phone]   [facility] [address], [place] [phone]   [facility] [address] [place] [phone]   Renaye Rakers [address], [place]   [phone] Only accepts [place] patients after they have their name applied to their card.   Self-Pay (no insurance) in [facility]:  Organization         Address  Phone   Notes  Sickle Cell Patients, [facility] [address] [place], [place] [phone]   [facility] [address] [place], [place] [phone]   [facility] [place]  [address], [place] [phone]   [facility]/Dr. Osei-Bonsu  [address] [place] or [address], [place] [phone] Phone number for both [place] and [place] locations is the same.  [facility] [address], [place] [phone]   [facility] [address], [place] or [phone]   [facility] [address], [place] [phone], phone; [phone])  [phone], fax Sees patients 1st and 3rd Saturday of every month.  Must not qualify for public or private insurance (i.e. Medicaid, Medicare, [place] Health Choice, Veterans' Benefits)  Household income should be no more than 200% of the poverty level The clinic cannot treat you if you are pregnant or think you are pregnant  Sexually transmitted diseases are not treated at the clinic.    Dental Care: Organization         Address  Phone  Notes  [facility] [address], [place] [phone] Accepts children up to age 41 who are enrolled in [place] or [place] Health Choice; pregnant women with a Medicaid card; and children who have applied for Medicaid or [place] Health Choice, but were declined, whose parents can pay a reduced fee at time of service.  [facility] [place]  [address], [place] [phone] Accepts children up to age 41 who are enrolled in [place] or [place] Health Choice; pregnant women with a Medicaid card; and children who have applied for Medicaid or  Health Choice, but were declined, whose parents can pay a reduced fee at time of service.  [facility]  [address], [place] [phone] Patients are seen by appointment only. Walk-ins are not accepted. [facility] will see patients 618 years of age and older. Monday - Tuesday (8am-5pm) Most Wednesdays (8:30-5pm) $30 per visit, cash only  [facility]  [address], [facility] [place] [phone] Patients are seen by appointment only. Walk-ins are not accepted. [facility] will see patients 41 years of age and older. One Wednesday Evening (Monthly: Volunteer Based).  $30 per visit, cash only  [employer] [facility] [facility]  [phone] for adults; Children under age 704, call Graduate Pediatric Dentistry at [phone]  [phone]. Children aged 24-14, please call [phone] to request a pediatric application.  Dental services are provided in all areas of dental care including fillings, crowns and bridges, complete and partial dentures, implants, gum treatment, root canals, and extractions. Preventive care is also provided. Treatment is provided to both adults and children. Patients are selected via a lottery and there is often a waiting list.   [facility] [address], [place]  [phone] [email or web address]   [facility] [address], [place], [place] [phone], Ext. [phone] Second and Fourth Thursday of each month, opens at 6:30 AM; Clinic ends at 9 AM.  Patients are seen on a first-come first-served basis, and a limited number are seen during each clinic.   [facility]  [address], [place], [place] [phone]   Eligibility Requirements You must have lived in [place], [place], or [place] for at least the last three months.   You cannot be eligible for state or federal sponsored [place] insurance, including [employer], [place], or [employer]icare.   You generally cannot be eligible for healthcare insurance through your employer.    How to apply: Eligibility screenings are held every Tuesday and Wednesday afternoon from 1:00 pm until 4:00 pm. You do not need an appointment for the interview!  [facility] [address], [place], [place] [phone]   [facility]  [phone]   [facility]  [phone]   [facility]  [phone]    Behavioral Health Resources in the Community: Intensive  Outpatient Programs Organization         Address  Phone  Notes  [facility] [address] [place], [place] [phone]   [facility] [address], [place], [place] [phone]   [facility]  [address] [place], [place]  [phone]   [facility] Mental [facility] [address],  [place], [place] [phone] or [phone]   Substance Abuse Resources Organization         Address  Phone  Notes  [facility]  [phone]   [facility]  [phone]   [facility]  [phone]   Floydene Flock  [phone]   Residential & Outpatient Substance Abuse Program  [phone]   Psychological Services Organization         Address  Phone  Notes  [facility]  [phone]   [facility] Services  [phone]   [facility] Health [address], [place] [phone] or [phone]    Mobile Crisis Teams Organization         Address  Phone  Notes  [facility], Mobile Crisis Care Unit  [phone]   [facility]  [address] [place], [place] [phone]   Doristine Locks [address] [place] [phone]    Self-Help/Support Groups Organization         Address  Phone             Notes  [facility] [place] - variety of support groups  [phone] Call for more information  Narcotics Anonymous (NA), [facility] [address], [employer] [place]  2 meetings at this location   Statistician         Address  Phone  Notes  [facility] [address],    [place]  [phone]   [facility]  [address] [place] [address], [place], [place] [phone]   [facility] [address] [place], [place] [phone] Admissions: 8am-3pm M-F  [facility] [address],    [place], [place] [phone]   [facility] [address] [place], [place], [place] [phone]   [facility] [address]  [place], [place] [phone]   [facility] - Intensive Outpatient [address], [place], [place]  [phone]   [facility] ([facility] [address] [place]  [place], [place] [phone] or [phone]   [facility] ([facility]) [address], [place], [place] [phone] Accepts Medicaid  [facility] [address]  [place] [phone] Substance Abuse/Addiction Treatment   [facility]         Address  Phone  Notes  [facility]  [phone]   Angie Fava, PhD [address] [place], [place]   [phone] or [phone]   [facility] Behavioral   [address] [place], [place] [phone]   [facility] [address], [place], [place] [phone] Insurance/Medicaid/sponsorship through [employer] and Families [address]                                    [place], [place] [phone] Therapy/tele-psych/case  [facility] [address], [place] [phone]  Dr. Adele Schilder  [phone]   [facility] Dept. 1) [address], [place] 2) [place] 3)  [place] [address], [place] [phone]  [phone]   [place] [phone] or [phone] (After Hours)

## 2014-11-01 NOTE — ED Notes (Signed)
Pt reports feeling like he has the flu x 24 hours. Having bodyaches, n/v/d, headache, sinus congestion, cough, chills. Also reports severe anxiety. Denies SI or HI.

## 2014-11-01 NOTE — ED Provider Notes (Addendum)
CSN: [id number]     Arrival date & time 11/01/14  1555 History   First MD Initiated Contact with Patient 11/01/14 1733     Chief Complaint  Patient presents with  . Emesis  . Diarrhea  . Anxiety     (Consider location/radiation/quality/duration/timing/severity/associated sxs/prior Treatment) Patient is a 39 y.o. male presenting with vomiting, diarrhea, and anxiety. The history is provided by the patient.  Emesis Associated symptoms: diarrhea   Associated symptoms: no abdominal pain, no chills, no headaches and no sore throat   Diarrhea Associated symptoms: vomiting   Associated symptoms: no abdominal pain, no chills, no fever and no headaches   Anxiety Pertinent negatives include no chest pain, no abdominal pain, no headaches and no shortness of breath.  Patient w hx anxiety, chronic back pain, presents indicating in past 1-2 days, increased episodic non productive cough, congestion, body aches, and episodic nvd.  Sub fever. Few episodes nvd. Emesis clear, not bloody or bilious. Diarrhea loose to watery. No abd pain or distension. Normal appetite. Pt denies dysuria or gu c/o. No known ill contacts. Patient/fam indicates he doesn't react well to illness, and that it generally makes his anxiety which is usually controlled worse. Pt states as a result trouble sleeping at night, increased anxiety. Also notes job stress. Denies depression. Normal appetite. +insomnia.       Past Medical History  Diagnosis Date  . Pancreatitis   . Chronic back pain   . Hypertension    Past Surgical History  Procedure Laterality Date  . Orthopedic surgery     History reviewed. No pertinent family history. History  Substance Use Topics  . Smoking status: Never Smoker   . Smokeless tobacco: Not on file  . Alcohol Use: No    Review of Systems  Constitutional: Negative for fever and chills.  HENT: Positive for congestion. Negative for sore throat and trouble swallowing.   Eyes: Negative for  redness.  Respiratory: Positive for cough. Negative for shortness of breath.   Cardiovascular: Negative for chest pain, palpitations and leg swelling.  Gastrointestinal: Positive for vomiting and diarrhea. Negative for abdominal pain.  Endocrine: Negative for polyuria.  Genitourinary: Negative for dysuria and flank pain.  Musculoskeletal: Negative for neck pain.  Skin: Negative for rash.  Neurological: Negative for headaches.  Hematological: Does not bruise/bleed easily.  Psychiatric/Behavioral: Negative for confusion. The patient is nervous/anxious.       Allergies  Review of patient's allergies indicates no known allergies.  Home Medications   Prior to Admission medications   Medication Sig Start Date End Date Taking? Authorizing Provider  albuterol (PROVENTIL HFA;VENTOLIN HFA) 108 (90 BASE) MCG/ACT inhaler Inhale into the lungs every 6 (six) hours as needed for wheezing or shortness of breath.    Historical Provider, MD  amoxicillin-clavulanate (AUGMENTIN) 875-125 MG per tablet Take 1 tablet by mouth 2 (two) times daily. One po bid x 7 days 07/06/14   Paula LibraJohn Molpus, MD  bifidobacterium infantis (ALIGN) capsule Take 1 capsule twice daily while taking antibiotic. Do not take within 2 hours of taking the antibiotic. 07/06/14   John Molpus, MD  chlorpheniramine-HYDROcodone (TUSSIONEX PENNKINETIC ER) 10-8 MG/5ML LQCR Take 5 mLs by mouth every 12 (twelve) hours as needed. 06/24/14   John Molpus, MD  cyclobenzaprine (FLEXERIL) 10 MG tablet Take 1 tablet (10 mg total) by mouth 2 (two) times daily as needed for muscle spasms. 08/04/14   Tiffany Neva SeatGreene, PA-C  ibuprofen (ADVIL,MOTRIN) 600 MG tablet Take 1 tablet (600 mg total) by  mouth every 6 (six) hours as needed. 08/04/14   Tiffany Neva Seat, PA-C  OVER THE COUNTER MEDICATION Take 1 Package by mouth daily. Sport multi pack vitamins    Historical Provider, MD  oxyCODONE-acetaminophen (PERCOCET/ROXICET) 5-325 MG per tablet Take 1 tablet by mouth every 6  (six) hours as needed for severe pain. 08/04/14   Tiffany Neva Seat, PA-C   BP 182/119 mmHg  Pulse 79  Temp(Src) 98 F (36.7 C) (Oral)  Resp 18  Ht  (1.905 m)  Wt 225 lb (102.059 kg)  BMI 28.12 kg/m2  SpO2 100% Physical Exam  Constitutional: He is oriented to person, place, and time. He appears well-developed and well-nourished. No distress.  HENT:  Mouth/Throat: Oropharynx is clear and moist.  Eyes: Conjunctivae are normal. Pupils are equal, round, and reactive to light. No scleral icterus.  Neck: Neck supple. No tracheal deviation present.  No stiffness or rigidity  Cardiovascular: Normal rate, regular rhythm, normal heart sounds and intact distal pulses.  Exam reveals no gallop and no friction rub.   No murmur heard. Pulmonary/Chest: Effort normal and breath sounds normal. No accessory muscle usage. No respiratory distress.  Abdominal: Soft. Bowel sounds are normal. He exhibits no distension. There is no tenderness.  Genitourinary:  No cva tenderness  Musculoskeletal: Normal range of motion. He exhibits no edema or tenderness.  Lymphadenopathy:    He has no cervical adenopathy.  Neurological: He is alert and oriented to person, place, and time.  Skin: Skin is warm and dry. No rash noted. He is not diaphoretic.  Psychiatric:  Anxious.   Nursing note and vitals reviewed.   ED Course  Procedures (including critical care time) Labs Review   Results for orders placed or performed during the hospital encounter of 11/01/14  CBC with Differential  Result Value Ref Range   WBC 12.7 (H) 4.0 - 10.5 K/uL   RBC 5.76 4.22 - 5.81 MIL/uL   Hemoglobin 17.6 (H) 13.0 - 17.0 g/dL   HCT 16.1 09.6 - 04.5 %   MCV 86.6 78.0 - 100.0 fL   MCH 30.6 26.0 - 34.0 pg   MCHC 35.3 30.0 - 36.0 g/dL   RDW 40.9 81.1 - 91.4 %   Platelets 411 (H) 150 - 400 K/uL   Neutrophils Relative % 78 (H) 43 - 77 %   Neutro Abs 10.1 (H) 1.7 - 7.7 K/uL   Lymphocytes Relative 13 12 - 46 %   Lymphs Abs 1.6 0.7 -  4.0 K/uL   Monocytes Relative 8 3 - 12 %   Monocytes Absolute 1.0 0.1 - 1.0 K/uL   Eosinophils Relative 1 0 - 5 %   Eosinophils Absolute 0.1 0.0 - 0.7 K/uL   Basophils Relative 0 0 - 1 %   Basophils Absolute 0.0 0.0 - 0.1 K/uL  Comprehensive metabolic panel  Result Value Ref Range   Sodium 136 135 - 145 mmol/L   Potassium 3.9 3.5 - 5.1 mmol/L   Chloride 102 101 - 111 mmol/L   CO2 25 22 - 32 mmol/L   Glucose, Bld 109 (H) 65 - 99 mg/dL   BUN 8 6 - 20 mg/dL   Creatinine, Ser 7.82 0.61 - 1.24 mg/dL   Calcium 9.3 8.9 - 95.6 mg/dL   Total Protein 7.3 6.5 - 8.1 g/dL   Albumin 3.9 3.5 - 5.0 g/dL   AST 46 (H) 15 - 41 U/L   ALT 80 (H) 17 - 63 U/L   Alkaline Phosphatase 51 38 - 126  U/L   Total Bilirubin 1.3 (H) 0.3 - 1.2 mg/dL   GFR calc non Af Amer >60 >60 mL/min   GFR calc Af Amer >60 >60 mL/min   Anion gap 9 5 - 15  Lipase, blood  Result Value Ref Range   Lipase 71 (H) 22 - 51 U/L  Urinalysis, Routine w reflex microscopic  Result Value Ref Range   Color, Urine AMBER (A) YELLOW   APPearance CLEAR CLEAR   Specific Gravity, Urine 1.019 1.005 - 1.030   pH 5.5 5.0 - 8.0   Glucose, UA NEGATIVE NEGATIVE mg/dL   Hgb urine dipstick NEGATIVE NEGATIVE   Bilirubin Urine NEGATIVE NEGATIVE   Ketones, ur NEGATIVE NEGATIVE mg/dL   Protein, ur NEGATIVE NEGATIVE mg/dL   Urobilinogen, UA 1.0 0.0 - 1.0 mg/dL   Nitrite NEGATIVE NEGATIVE   Leukocytes, UA NEGATIVE NEGATIVE   Dg Chest 2 View  11/01/2014   CLINICAL DATA:  Acute onset of productive cough. Initial encounter. Chest radiograph performed 08/04/2014  EXAM: CHEST  2 VIEW  COMPARISON:  Chest radiograph performed 08/04/2014  FINDINGS: The lungs are well-aerated and clear. There is no evidence of focal opacification, pleural effusion or pneumothorax.  The heart is normal in size; the mediastinal contour is within normal limits. No acute osseous abnormalities are seen. Chronic right-sided rib deformities are noted.  IMPRESSION: No acute  cardiopulmonary process seen.   Electronically Signed   By: Roanna RaiderJeffery  Chang M.D.   On: 11/01/2014 19:07       MDM   Iv ns bolus. Pt requests med for anxiety, has ride, does not have to drive. Ativan 1 mg iv.  Labs.  Cxr.  Reviewed nursing notes and prior charts for additional history.   Constellation symptoms c/w viral illness, possibly flu. Recent onset. Pt/fam requests rx tamiflu.  Current bp, and bp on prior ed visits high.  No headache. No visual change. No numbness/weakness. No cp or sob. Discussed bp, and will give rx.  [facility] and bh f/u.  Pt request med at home for anxiety.  Also request sleep aid. Will provide small quantity rx, discussed do not take medications together or at same time, rather either/or.   Pt currently comfortable. Anxiety improved. Calmer. Breathing comfortably, no increased wob. Tolerating po. Has ride, does not have to drive.  Pt currently appears stable for d/c.      Cathren LaineKevin Davionte Lusby, MD 11/01/14 2116

## 2014-12-23 ENCOUNTER — Emergency Department (HOSPITAL_COMMUNITY): Payer: Self-pay

## 2014-12-23 ENCOUNTER — Emergency Department (HOSPITAL_COMMUNITY)
Admission: EM | Admit: 2014-12-23 | Discharge: 2014-12-23 | Disposition: A | Discharge status: Home / Self Care | Payer: Self-pay | Attending: Emergency Medicine | Admitting: Emergency Medicine

## 2014-12-23 ENCOUNTER — Encounter (HOSPITAL_COMMUNITY): Payer: Self-pay | Admitting: Emergency Medicine

## 2014-12-23 DIAGNOSIS — Y998 Other external cause status: Secondary | ICD-10-CM | POA: Insufficient documentation

## 2014-12-23 DIAGNOSIS — S93401A Sprain of unspecified ligament of right ankle, initial encounter: Secondary | ICD-10-CM | POA: Insufficient documentation

## 2014-12-23 DIAGNOSIS — Y9389 Activity, other specified: Secondary | ICD-10-CM | POA: Insufficient documentation

## 2014-12-23 DIAGNOSIS — Z8719 Personal history of other diseases of the digestive system: Secondary | ICD-10-CM | POA: Insufficient documentation

## 2014-12-23 DIAGNOSIS — Z9889 Other specified postprocedural states: Secondary | ICD-10-CM | POA: Insufficient documentation

## 2014-12-23 DIAGNOSIS — G8929 Other chronic pain: Secondary | ICD-10-CM | POA: Insufficient documentation

## 2014-12-23 DIAGNOSIS — S93601A Unspecified sprain of right foot, initial encounter: Secondary | ICD-10-CM | POA: Insufficient documentation

## 2014-12-23 DIAGNOSIS — I10 Essential (primary) hypertension: Secondary | ICD-10-CM | POA: Insufficient documentation

## 2014-12-23 DIAGNOSIS — W010XXA Fall on same level from slipping, tripping and stumbling without subsequent striking against object, initial encounter: Secondary | ICD-10-CM | POA: Insufficient documentation

## 2014-12-23 DIAGNOSIS — Y9289 Other specified places as the place of occurrence of the external cause: Secondary | ICD-10-CM | POA: Insufficient documentation

## 2014-12-23 DIAGNOSIS — Z79899 Other long term (current) drug therapy: Secondary | ICD-10-CM | POA: Insufficient documentation

## 2014-12-23 MED ORDER — AMLODIPINE BESYLATE 5 MG PO TABS
5.0000 mg | ORAL_TABLET | Freq: Once | ORAL | Status: AC
  Administered 2014-12-23: 5 mg via ORAL
  Filled 2014-12-23: qty 1

## 2014-12-23 MED ORDER — HYDROCODONE-ACETAMINOPHEN 5-325 MG PO TABS: 1.0000 | ORAL_TABLET | Freq: Four times a day (QID) | ORAL | Status: DC | PRN

## 2014-12-23 MED ORDER — OXYCODONE-ACETAMINOPHEN 5-325 MG PO TABS
1.0000 | ORAL_TABLET | Freq: Once | ORAL | Status: AC
  Administered 2014-12-23: 1 via ORAL

## 2014-12-23 MED ORDER — OXYCODONE-ACETAMINOPHEN 5-325 MG PO TABS
2.0000 | ORAL_TABLET | Freq: Once | ORAL | Status: AC
  Administered 2014-12-23: 2 via ORAL
  Filled 2014-12-23: qty 2

## 2014-12-23 MED ORDER — OXYCODONE-ACETAMINOPHEN 5-325 MG PO TABS
ORAL_TABLET | ORAL | Status: AC
  Filled 2014-12-23: qty 1

## 2014-12-23 MED ORDER — AMLODIPINE BESYLATE 5 MG PO TABS: 5.0000 mg | ORAL_TABLET | Freq: Every day | ORAL | Status: DC

## 2014-12-23 NOTE — ED Notes (Signed)
Pt. Left with all belongings 

## 2014-12-23 NOTE — ED Provider Notes (Addendum)
CSN: [id number]     Arrival date & time 12/23/14  2132 History   First MD Initiated Contact with Patient 12/23/14 2212     Chief Complaint  Patient presents with  . Ankle Pain     (Consider location/radiation/quality/duration/timing/severity/associated sxs/prior Treatment) Patient is a 39 y.o. male presenting with ankle pain.  Ankle Pain Associated symptoms: no back pain, no fever and no neck pain   Patient s/p injury to right ankle and foot just pta today. States he slipped, twisted, then contused his right foot and ankle. Note remote hx fx/surgery on the same foot and ankle. Skin intact. C/o pain to dorsum foot as well as ankle. Pain moderate, constant, dull, worse w movement and palpation. Denies other pain or injury.  No numbness/weakness.      Past Medical History  Diagnosis Date  . Pancreatitis   . Chronic back pain   . Hypertension    Past Surgical History  Procedure Laterality Date  . Orthopedic surgery     No family history on file. History  Substance Use Topics  . Smoking status: Never Smoker   . Smokeless tobacco: Not on file  . Alcohol Use: No    Review of Systems  Constitutional: Negative for fever.  Musculoskeletal: Negative for back pain and neck pain.  Skin: Negative for wound.  Neurological: Negative for syncope, weakness, light-headedness and numbness.      Allergies  Review of patient's allergies indicates no known allergies.  Home Medications   Prior to Admission medications   Medication Sig Start Date End Date Taking? Authorizing Provider  albuterol (PROVENTIL HFA;VENTOLIN HFA) 108 (90 BASE) MCG/ACT inhaler Inhale into the lungs every 6 (six) hours as needed for wheezing or shortness of breath.   Yes Historical Provider, MD  Multiple Vitamin (MULTIVITAMIN WITH MINERALS) TABS tablet Take 1 tablet by mouth daily.   Yes Historical Provider, MD  ALPRAZolam Prudy Feeler(XANAX) 0.5 MG tablet Take 1 tablet (0.5 mg total) by mouth 3 (three) times daily as needed  for anxiety. No driving when taking. Patient not taking: Reported on 12/23/2014 11/01/14   Cathren LaineKevin Etna Forquer, MD  amLODipine (NORVASC) 5 MG tablet Take 1 tablet (5 mg total) by mouth daily. Patient not taking: Reported on 12/23/2014 11/01/14   Cathren LaineKevin Ahtziry Saathoff, MD  bifidobacterium infantis (ALIGN) capsule Take 1 capsule twice daily while taking antibiotic. Do not take within 2 hours of taking the antibiotic. Patient not taking: Reported on 11/01/2014 07/06/14   Paula LibraJohn Molpus, MD  chlorpheniramine-HYDROcodone [facility](TUSSIONEX PENNKINETIC ER) 10-8 MG/5ML LQCR Take 5 mLs by mouth every 12 (twelve) hours as needed. Patient not taking: Reported on 11/01/2014 06/24/14   Paula LibraJohn Molpus, MD  cyclobenzaprine (FLEXERIL) 10 MG tablet Take 1 tablet (10 mg total) by mouth 2 (two) times daily as needed for muscle spasms. Patient not taking: Reported on 11/01/2014 08/04/14   Marlon Peliffany Greene, PA-C  ibuprofen (ADVIL,MOTRIN) 600 MG tablet Take 1 tablet (600 mg total) by mouth every 6 (six) hours as needed. Patient not taking: Reported on 12/23/2014 08/04/14   Marlon Peliffany Greene, PA-C  oseltamivir (TAMIFLU) 75 MG capsule Take 1 capsule (75 mg total) by mouth every 12 (twelve) hours. Patient not taking: Reported on 12/23/2014 11/01/14   Cathren LaineKevin Dequann Vandervelden, MD  oxyCODONE-acetaminophen (PERCOCET/ROXICET) 5-325 MG per tablet Take 1 tablet by mouth every 6 (six) hours as needed for severe pain. Patient not taking: Reported on 11/01/2014 08/04/14   Marlon Peliffany Greene, PA-C  zolpidem (AMBIEN) 5 MG tablet Take 1 tablet (5 mg total) by mouth  at bedtime as needed for sleep. No driving when taking. Patient not taking: Reported on 12/23/2014 11/01/14   Cathren Laine, MD   BP 168/107 mmHg  Pulse 82  Temp(Src) 98 F (36.7 C) (Oral)  Resp 16  Ht 6\' 3"  (1.905 m)  Wt 218 lb (98.884 kg)  BMI 27.25 kg/m2  SpO2 96% Physical Exam  Constitutional: He is oriented to person, place, and time. He appears well-developed and well-nourished. No distress.  HENT:  Head: Atraumatic.   Mouth/Throat: Oropharynx is clear and moist.  Eyes: Conjunctivae are normal.  Neck: Neck supple. No tracheal deviation present.  Cardiovascular: Normal rate.   Pulmonary/Chest: Effort normal. No accessory muscle usage. No respiratory distress.  Musculoskeletal: Normal range of motion.  Tenderness right medial and lat malleolus, and mid right foot. Ankle grossly stable. Dp/pt palp. Normal cap refill in toes.   Neurological: He is alert and oriented to person, place, and time.  Able to plantar and dorsiflex foot and toes. sens grossly intact.   Skin: Skin is warm and dry. He is not diaphoretic.  Psychiatric: He has a normal mood and affect.  Nursing note and vitals reviewed.   ED Course  Procedures (including critical care time) Labs Review  Results for orders placed or performed during the hospital encounter of 11/01/14  CBC with Differential  Result Value Ref Range   WBC 12.7 (H) 4.0 - 10.5 K/uL   RBC 5.76 4.22 - 5.81 MIL/uL   Hemoglobin 17.6 (H) 13.0 - 17.0 g/dL   HCT 16.1 09.6 - 04.5 %   MCV 86.6 78.0 - 100.0 fL   MCH 30.6 26.0 - 34.0 pg   MCHC 35.3 30.0 - 36.0 g/dL   RDW 40.9 81.1 - 91.4 %   Platelets 411 (H) 150 - 400 K/uL   Neutrophils Relative % 78 (H) 43 - 77 %   Neutro Abs 10.1 (H) 1.7 - 7.7 K/uL   Lymphocytes Relative 13 12 - 46 %   Lymphs Abs 1.6 0.7 - 4.0 K/uL   Monocytes Relative 8 3 - 12 %   Monocytes Absolute 1.0 0.1 - 1.0 K/uL   Eosinophils Relative 1 0 - 5 %   Eosinophils Absolute 0.1 0.0 - 0.7 K/uL   Basophils Relative 0 0 - 1 %   Basophils Absolute 0.0 0.0 - 0.1 K/uL  Comprehensive metabolic panel  Result Value Ref Range   Sodium 136 135 - 145 mmol/L   Potassium 3.9 3.5 - 5.1 mmol/L   Chloride 102 101 - 111 mmol/L   CO2 25 22 - 32 mmol/L   Glucose, Bld 109 (H) 65 - 99 mg/dL   BUN 8 6 - 20 mg/dL   Creatinine, Ser 7.82 0.61 - 1.24 mg/dL   Calcium 9.3 8.9 - 95.6 mg/dL   Total Protein 7.3 6.5 - 8.1 g/dL   Albumin 3.9 3.5 - 5.0 g/dL   AST 46 (H) 15 -  41 U/L   ALT 80 (H) 17 - 63 U/L   Alkaline Phosphatase 51 38 - 126 U/L   Total Bilirubin 1.3 (H) 0.3 - 1.2 mg/dL   GFR calc non Af Amer >60 >60 mL/min   GFR calc Af Amer >60 >60 mL/min   Anion gap 9 5 - 15  Lipase, blood  Result Value Ref Range   Lipase 71 (H) 22 - 51 U/L  Urinalysis, Routine w reflex microscopic  Result Value Ref Range   Color, Urine AMBER (A) YELLOW   APPearance CLEAR CLEAR  Specific Gravity, Urine 1.019 1.005 - 1.030   pH 5.5 5.0 - 8.0   Glucose, UA NEGATIVE NEGATIVE mg/dL   Hgb urine dipstick NEGATIVE NEGATIVE   Bilirubin Urine NEGATIVE NEGATIVE   Ketones, ur NEGATIVE NEGATIVE mg/dL   Protein, ur NEGATIVE NEGATIVE mg/dL   Urobilinogen, UA 1.0 0.0 - 1.0 mg/dL   Nitrite NEGATIVE NEGATIVE   Leukocytes, UA NEGATIVE NEGATIVE   Dg Ankle Complete Right  12/23/2014   CLINICAL DATA:  Fall in bathroom with right ankle twisting and pain. Initial encounter.  EXAM: RIGHT ANKLE - COMPLETE 3+ VIEW  COMPARISON:  10/18/2003  FINDINGS: Remote distal fibula fracture status post ORIF. There is no adverse hardware finding. There has been progressive bridging ossification at the lower syndesmosis. Degenerative spurring about the ankle joint is new from 2005; no progressive joint narrowing. No acute fracture or dislocation. Small heel spur.  IMPRESSION: 1. No acute findings. 2. Posttraumatic findings noted above.   Electronically Signed   By: Marnee Spring M.D.   On: 12/23/2014 22:30   Dg Foot Complete Right  12/23/2014   CLINICAL DATA:  Fall in the bathroom, possible fracture, twisted right ankle  EXAM: RIGHT FOOT COMPLETE - 3+ VIEW  COMPARISON:  None.  FINDINGS: Three views of the right foot submitted. No acute fracture or subluxation. Metallic fixation plate and screws noted in distal fibula. Tiny plantar spur of calcaneus.  IMPRESSION: No acute fracture or subluxation. Postsurgical changes distal fibula. Tiny plantar spur of calcaneus.   Electronically Signed   By: Natasha Mead M.D.    On: 12/23/2014 22:30       MDM   Xrays.  Pain rx.  Reviewed nursing notes and prior charts for additional history.   Recheck bp high. No headache. No neurologic signs or symptoms. No recent cp. No edema.   Pt notes hx htn, and was on med previously/remotely.  Prior records indicate prior rx amolodipine.  norvasc 5 mg po.  Pt requests pain med. Pt has ride, does not have to drive.  Percocet po.  Recheck dp/pt 2+. Normal color to foot. Foot nvi. Aso.   Crutches.  Discussed xrays w pt.  Pain controlled.   Also discussed pcp f/u re bp.    Cathren Laine, MD 12/23/14 [phone]

## 2014-12-23 NOTE — Discharge Instructions (Signed)
It was our pleasure to provide your ER care today - we hope that you feel better.  Wear brace/use crutches, as need for comfort/support for the next few days.  Elevate foot/coldpack to sore area.  Take motrin or aleve as need for pain. You may also take hydrocodone as need for pain. No driving when taking hydrocodone. Also, do not take tylenol or acetaminophen containing medication when taking hydrocodone.  Your blood pressure is high today - take blood pressure medication, and follow up with primary care doctor for recheck in coming week.  Return to ER if worse, new symptoms, foot numbness/weakness, severe or intractable pain, other concern.  You were given pain medication in the ER - no driving for the next 4 hours.   Ankle Sprain An ankle sprain is an injury to the strong, fibrous tissues (ligaments) that hold the bones of your ankle joint together.  CAUSES An ankle sprain is usually caused by a fall or by twisting your ankle. Ankle sprains most commonly occur when you step on the outer edge of your foot, and your ankle turns inward. People who participate in sports are more prone to these types of injuries.  SYMPTOMS   Pain in your ankle. The pain may be present at rest or only when you are trying to stand or walk.  Swelling.  Bruising. Bruising may develop immediately or within 1 to 2 days after your injury.  Difficulty standing or walking, particularly when turning corners or changing directions. DIAGNOSIS  Your caregiver will ask you details about your injury and perform a physical exam of your ankle to determine if you have an ankle sprain. During the physical exam, your caregiver will press on and apply pressure to specific areas of your foot and ankle. Your caregiver will try to move your ankle in certain ways. An X-ray exam may be done to be sure a bone was not broken or a ligament did not separate from one of the bones in your ankle (avulsion fracture).  TREATMENT  Certain  types of braces can help stabilize your ankle. Your caregiver can make a recommendation for this. Your caregiver may recommend the use of medicine for pain. If your sprain is severe, your caregiver may refer you to a surgeon who helps to restore function to parts of your skeletal system (orthopedist) or a physical therapist. HOME CARE INSTRUCTIONS   Apply ice to your injury for 1-2 days or as directed by your caregiver. Applying ice helps to reduce inflammation and pain.  Put ice in a plastic bag.  Place a towel between your skin and the bag.  Leave the ice on for 15-20 minutes at a time, every 2 hours while you are awake.  Only take over-the-counter or prescription medicines for pain, discomfort, or fever as directed by your caregiver.  Elevate your injured ankle above the level of your heart as much as possible for 2-3 days.  If your caregiver recommends crutches, use them as instructed. Gradually put weight on the affected ankle. Continue to use crutches or a cane until you can walk without feeling pain in your ankle.  If you have a plaster splint, wear the splint as directed by your caregiver. Do not rest it on anything harder than a pillow for the first 24 hours. Do not put weight on it. Do not get it wet. You may take it off to take a shower or bath.  You may have been given an elastic bandage to wear around your  ankle to provide support. If the elastic bandage is too tight (you have numbness or tingling in your foot or your foot becomes cold and blue), adjust the bandage to make it comfortable.  If you have an air splint, you may blow more air into it or let air out to make it more comfortable. You may take your splint off at night and before taking a shower or bath. Wiggle your toes in the splint several times per day to decrease swelling. SEEK MEDICAL CARE IF:   You have rapidly increasing bruising or swelling.  Your toes feel extremely cold or you lose feeling in your  foot.  Your pain is not relieved with medicine. SEEK IMMEDIATE MEDICAL CARE IF:  Your toes are numb or blue.  You have severe pain that is increasing. MAKE SURE YOU:   Understand these instructions.  Will watch your condition.  Will get help right away if you are not doing well or get worse. Document Released: 06/06/2005 Document Revised: 02/29/2012 Document Reviewed: 06/18/2011 [facility] Patient Information 2015 [place], [place]. This information is not intended to replace advice given to you by your health care provider. Make sure you discuss any questions you have with your health care provider.  Foot Sprain The muscles and cord like structures which attach muscle to bone (tendons) that surround the feet are made up of units. A foot sprain can occur at the weakest spot in any of these units. This condition is most often caused by injury to or overuse of the foot, as from playing contact sports, or aggravating a previous injury, or from poor conditioning, or obesity. SYMPTOMS  Pain with movement of the foot.  Tenderness and swelling at the injury site.  Loss of strength is present in moderate or severe sprains. THE THREE GRADES OR SEVERITY OF FOOT SPRAIN ARE:  Mild (Grade I): Slightly pulled muscle without tearing of muscle or tendon fibers or loss of strength.  Moderate (Grade II): Tearing of fibers in a muscle, tendon, or at the attachment to bone, with small decrease in strength.  Severe (Grade III): Rupture of the muscle-tendon-bone attachment, with separation of fibers. Severe sprain requires surgical repair. Often repeating (chronic) sprains are caused by overuse. Sudden (acute) sprains are caused by direct injury or over-use. DIAGNOSIS  Diagnosis of this condition is usually by your own observation. If problems continue, a caregiver may be required for further evaluation and treatment. X-rays may be required to make sure there are not breaks in the bones (fractures) present.  Continued problems may require physical therapy for treatment. PREVENTION  Use strength and conditioning exercises appropriate for your sport.  Warm up properly prior to working out.  Use athletic shoes that are made for the sport you are participating in.  Allow adequate time for healing. Early return to activities makes repeat injury more likely, and can lead to an unstable arthritic foot that can result in prolonged disability. Mild sprains generally heal in 3 to 10 days, with moderate and severe sprains taking 2 to 10 weeks. Your caregiver can help you determine the proper time required for healing. HOME CARE INSTRUCTIONS   Apply ice to the injury for 15-20 minutes, 03-04 times per day. Put the ice in a plastic bag and place a towel between the bag of ice and your skin.  An elastic wrap (like an Ace bandage) may be used to keep swelling down.  Keep foot above the level of the heart, or at least raised on a  footstool, when swelling and pain are present.  Try to avoid use other than gentle range of motion while the foot is painful. Do not resume use until instructed by your caregiver. Then begin use gradually, not increasing use to the point of pain. If pain does develop, decrease use and continue the above measures, gradually increasing activities that do not cause discomfort, until you gradually achieve normal use.  Use crutches if and as instructed, and for the length of time instructed.  Keep injured foot and ankle wrapped between treatments.  Massage foot and ankle for comfort and to keep swelling down. Massage from the toes up towards the knee.  Only take over-the-counter or prescription medicines for pain, discomfort, or fever as directed by your caregiver. SEEK IMMEDIATE MEDICAL CARE IF:   Your pain and swelling increase, or pain is not controlled with medications.  You have loss of feeling in your foot or your foot turns cold or blue.  You develop new, unexplained  symptoms, or an increase of the symptoms that brought you to your caregiver. MAKE SURE YOU:   Understand these instructions.  Will watch your condition.  Will get help right away if you are not doing well or get worse. Document Released: 11/26/2001 Document Revised: 08/29/2011 Document Reviewed: 01/24/2008 [facility] Patient Information 2015 [place], [place]. This information is not intended to replace advice given to you by your health care provider. Make sure you discuss any questions you have with your health care provider. '   Cryotherapy Cryotherapy means treatment with cold. Ice or gel packs can be used to reduce both pain and swelling. Ice is the most helpful within the first 24 to 48 hours after an injury or flare-up from overusing a muscle or joint. Sprains, strains, spasms, burning pain, shooting pain, and aches can all be eased with ice. Ice can also be used when recovering from surgery. Ice is effective, has very few side effects, and is safe for most people to use. PRECAUTIONS  Ice is not a safe treatment option for people with:  Raynaud phenomenon. This is a condition affecting small blood vessels in the extremities. Exposure to cold may cause your problems to return.  Cold hypersensitivity. There are many forms of cold hypersensitivity, including:  Cold urticaria. Red, itchy hives appear on the skin when the tissues begin to warm after being iced.  Cold erythema. This is a red, itchy rash caused by exposure to cold.  Cold hemoglobinuria. Red blood cells break down when the tissues begin to warm after being iced. The hemoglobin that carry oxygen are passed into the urine because they cannot combine with blood proteins fast enough.  Numbness or altered sensitivity in the area being iced. If you have any of the following conditions, do not use ice until you have discussed cryotherapy with your caregiver:  Heart conditions, such as arrhythmia, angina, or chronic heart  disease.  High blood pressure.  Healing wounds or open skin in the area being iced.  Current infections.  Rheumatoid arthritis.  Poor circulation.  Diabetes. Ice slows the blood flow in the region it is applied. This is beneficial when trying to stop inflamed tissues from spreading irritating chemicals to surrounding tissues. However, if you expose your skin to cold temperatures for too long or without the proper protection, you can damage your skin or nerves. Watch for signs of skin damage due to cold. HOME CARE INSTRUCTIONS Follow these tips to use ice and cold packs safely.  Place a dry  or damp towel between the ice and skin. A damp towel will cool the skin more quickly, so you may need to shorten the time that the ice is used.  For a more rapid response, add gentle compression to the ice.  Ice for no more than 10 to 20 minutes at a time. The bonier the area you are icing, the less time it will take to get the benefits of ice.  Check your skin after 5 minutes to make sure there are no signs of a poor response to cold or skin damage.  Rest 20 minutes or more between uses.  Once your skin is numb, you can end your treatment. You can test numbness by very lightly touching your skin. The touch should be so light that you do not see the skin dimple from the pressure of your fingertip. When using ice, most people will feel these normal sensations in this order: cold, burning, aching, and numbness.  Do not use ice on someone who cannot communicate their responses to pain, such as small children or people with dementia. HOW TO MAKE AN ICE PACK Ice packs are the most common way to use ice therapy. Other methods include ice massage, ice baths, and cryosprays. Muscle creams that cause a cold, tingly feeling do not offer the same benefits that ice offers and should not be used as a substitute unless recommended by your caregiver. To make an ice pack, do one of the following:  Place crushed  ice or a bag of frozen vegetables in a sealable plastic bag. Squeeze out the excess air. Place this bag inside another plastic bag. Slide the bag into a pillowcase or place a damp towel between your skin and the bag.  Mix 3 parts water with 1 part rubbing alcohol. Freeze the mixture in a sealable plastic bag. When you remove the mixture from the freezer, it will be slushy. Squeeze out the excess air. Place this bag inside another plastic bag. Slide the bag into a pillowcase or place a damp towel between your skin and the bag. SEEK MEDICAL CARE IF:  You develop white spots on your skin. This may give the skin a blotchy (mottled) appearance.  Your skin turns blue or pale.  Your skin becomes waxy or hard.  Your swelling gets worse. MAKE SURE YOU:   Understand these instructions.  Will watch your condition.  Will get help right away if you are not doing well or get worse. Document Released: 01/31/2011 Document Revised: 10/21/2013 Document Reviewed: 01/31/2011 [facility] Patient Information 2015 [place], [place]. This information is not intended to replace advice given to you by your health care provider. Make sure you discuss any questions you have with your health care provider.   Crutch Use Crutches are used to take weight off one of your legs or feet when you stand or walk. It is important to use crutches that fit properly. When fitted properly:  Each crutch should be 2-3 finger widths below the armpit.  Your weight should be supported by your hand, and not by resting the armpit on the crutch.  RISKS AND COMPLICATIONS Damage to the nerves that extend from your armpit to your hand and arm. To prevent this from happening, make sure your crutches fit properly and do not put pressure on your armpit when using them. HOW TO USE YOUR CRUTCHES If you have been instructed to use partial weight bearing, apply (bear) the amount of weight as your health care provider suggests. Do not  bear weight in  an amount that causes pain to the area of injury. Walking  Step with the crutches.  Swing the healthy leg slightly ahead of the crutches. Going Up Steps If there is no handrail:  Step up with the healthy leg.  Step up with the crutches and injured leg.  Continue in this way. If there is a handrail:  Hold both crutches in one hand.  Place your free hand on the handrail.  While putting your weight on your arms, lift your healthy leg to the step.  Bring the crutches and the injured leg up to that step.  Continue in this way. Going Down Steps Be very careful, as going down stairs with crutches is very challenging. If there is no handrail:  Step down with the injured leg and crutches.  Step down with the healthy leg. If there is a handrail:  Place your hand on the handrail.  Hold both crutches with your free hand.  Lower your injured leg and crutch to the step below you. Make sure to keep the crutch tips in the center of the step, never on the edge.  Lower your healthy leg to that step.  Continue in this way. Standing Up  Hold the injured leg forward.  Grab the armrest with one hand and the top of the crutches with the other hand.  Using these supports, pull yourself up to a standing position. Sitting Down 1. Hold the injured leg forward. 2. Grab the armrest with one hand and the top of the crutches with the other hand. 3. Lower yourself to a sitting position. SEEK MEDICAL CARE IF:  You still feel unsteady on your feet.  You develop new pain, for example in your armpits, back, shoulder, wrist, or hip.  You develop any numbness or tingling. SEEK IMMEDIATE MEDICAL CARE IF: You fall. Document Released: 06/03/2000 Document Revised: 06/11/2013 Document Reviewed: 02/11/2013 [facility] Patient Information 2015 [place]. This information is not intended to replace advice given to you by your health care provider. Make sure you discuss any questions you have with  your health care provider.   Hypertension Hypertension, commonly called high blood pressure, is when the force of blood pumping through your arteries is too strong. Your arteries are the blood vessels that carry blood from your heart throughout your body. A blood pressure reading consists of a higher number over a lower number, such as 110/72. The higher number (systolic) is the pressure inside your arteries when your heart pumps. The lower number (diastolic) is the pressure inside your arteries when your heart relaxes. Ideally you want your blood pressure below 120/80. Hypertension forces your heart to work harder to pump blood. Your arteries may become narrow or stiff. Having hypertension puts you at risk for heart disease, stroke, and other problems.  RISK FACTORS Some risk factors for high blood pressure are controllable. Others are not.  Risk factors you cannot control include:   Race. You may be at higher risk if you are African American.  Age. Risk increases with age.  Gender. Men are at higher risk than women before age 41 years. After age 41, women are at higher risk than men. Risk factors you can control include:  Not getting enough exercise or physical activity.  Being overweight.  Getting too much fat, sugar, calories, or salt in your diet.  Drinking too much alcohol. SIGNS AND SYMPTOMS Hypertension does not usually cause signs or symptoms. Extremely high blood pressure (hypertensive crisis) may cause  headache, anxiety, shortness of breath, and nosebleed. DIAGNOSIS  To check if you have hypertension, your health care provider will measure your blood pressure while you are seated, with your arm held at the level of your heart. It should be measured at least twice using the same arm. Certain conditions can cause a difference in blood pressure between your right and left arms. A blood pressure reading that is higher than normal on one occasion does not mean that you need  treatment. If one blood pressure reading is high, ask your health care provider about having it checked again. TREATMENT  Treating high blood pressure includes making lifestyle changes and possibly taking medicine. Living a healthy lifestyle can help lower high blood pressure. You may need to change some of your habits. Lifestyle changes may include:  Following the DASH diet. This diet is high in fruits, vegetables, and whole grains. It is low in salt, red meat, and added sugars.  Getting at least 2 hours of brisk physical activity every week.  Losing weight if necessary.  Not smoking.  Limiting alcoholic beverages.  Learning ways to reduce stress. If lifestyle changes are not enough to get your blood pressure under control, your health care provider may prescribe medicine. You may need to take more than one. Work closely with your health care provider to understand the risks and benefits. HOME CARE INSTRUCTIONS  Have your blood pressure rechecked as directed by your health care provider.   Take medicines only as directed by your health care provider. Follow the directions carefully. Blood pressure medicines must be taken as prescribed. The medicine does not work as well when you skip doses. Skipping doses also puts you at risk for problems.   Do not smoke.   Monitor your blood pressure at home as directed by your health care provider. SEEK MEDICAL CARE IF:   You think you are having a reaction to medicines taken.  You have recurrent headaches or feel dizzy.  You have swelling in your ankles.  You have trouble with your vision. SEEK IMMEDIATE MEDICAL CARE IF:  You develop a severe headache or confusion.  You have unusual weakness, numbness, or feel faint.  You have severe chest or abdominal pain.  You vomit repeatedly.  You have trouble breathing. MAKE SURE YOU:   Understand these instructions.  Will watch your condition.  Will get help right away if you are  not doing well or get worse. Document Released: 06/06/2005 Document Revised: 10/21/2013 Document Reviewed: 03/29/2013 [facility] Patient Information 2015 [place], [place]. This information is not intended to replace advice given to you by your health care provider. Make sure you discuss any questions you have with your health care provider.

## 2014-12-23 NOTE — ED Notes (Addendum)
Pt from home for eval of right ankle pain after fall, pt states he is remodeling  His house and tripped and fell on his right ankle. Pt states hx of ankle fracture and surgery in 12/04. Denies any LOC, or taking blood thinners at this time. Swelling noted to ankle, pulses present, but pt foot noted to by cyanotic and cold to touch, pt unable to move any toes except great toe.

## 2015-03-21 ENCOUNTER — Emergency Department (HOSPITAL_COMMUNITY): Payer: Self-pay

## 2015-03-21 ENCOUNTER — Emergency Department (HOSPITAL_COMMUNITY)
Admission: EM | Admit: 2015-03-21 | Discharge: 2015-03-22 | Disposition: A | Discharge status: Home / Self Care | Payer: Self-pay | Attending: Physician Assistant | Admitting: Physician Assistant

## 2015-03-21 DIAGNOSIS — G43009 Migraine without aura, not intractable, without status migrainosus: Secondary | ICD-10-CM

## 2015-03-21 DIAGNOSIS — I10 Essential (primary) hypertension: Secondary | ICD-10-CM | POA: Insufficient documentation

## 2015-03-21 DIAGNOSIS — G43819 Other migraine, intractable, without status migrainosus: Secondary | ICD-10-CM | POA: Insufficient documentation

## 2015-03-21 DIAGNOSIS — G8929 Other chronic pain: Secondary | ICD-10-CM | POA: Insufficient documentation

## 2015-03-21 DIAGNOSIS — Z79899 Other long term (current) drug therapy: Secondary | ICD-10-CM | POA: Insufficient documentation

## 2015-03-21 LAB — COMPREHENSIVE METABOLIC PANEL
ALT: 79 U/L — ABNORMAL HIGH (ref 17–63)
ANION GAP: 6 (ref 5–15)
AST: 51 U/L — ABNORMAL HIGH (ref 15–41)
Albumin: 4.1 g/dL (ref 3.5–5.0)
Alkaline Phosphatase: 34 U/L — ABNORMAL LOW (ref 38–126)
BUN: 11 mg/dL (ref 6–20)
CO2: 28 mmol/L (ref 22–32)
Calcium: 8.9 mg/dL (ref 8.9–10.3)
Chloride: 100 mmol/L — ABNORMAL LOW (ref 101–111)
Creatinine, Ser: 1.42 mg/dL — ABNORMAL HIGH (ref 0.61–1.24)
GFR calc non Af Amer: >60 mL/min (ref >60)
Glucose, Bld: 93 mg/dL (ref 65–99)
Potassium: 4.9 mmol/L (ref 3.5–5.1)
SODIUM: 134 mmol/L — AB (ref 135–145)
TOTAL PROTEIN: 6.9 g/dL (ref 6.5–8.1)
Total Bilirubin: 1.3 mg/dL — ABNORMAL HIGH (ref 0.3–1.2)

## 2015-03-21 LAB — CBG MONITORING, ED: GLUCOSE-CAPILLARY: 107 mg/dL — AB (ref 65–99)

## 2015-03-21 LAB — CBC
HCT: 51.8 % (ref 39.0–52.0)
Hemoglobin: 17.8 g/dL — ABNORMAL HIGH (ref 13.0–17.0)
MCH: 30.8 pg (ref 26.0–34.0)
MCHC: 34.4 g/dL (ref 30.0–36.0)
MCV: 89.8 fL (ref 78.0–100.0)
PLATELETS: 332 10*3/uL (ref 150–400)
RBC: 5.77 MIL/uL (ref 4.22–5.81)
RDW: 14.7 % (ref 11.5–15.5)
WBC: 8.7 10*3/uL (ref 4.0–10.5)

## 2015-03-21 LAB — I-STAT TROPONIN, ED: Troponin i, poc: 0.01 ng/mL (ref 0.00–0.08)

## 2015-03-21 LAB — I-STAT CHEM 8, ED
BUN: 15 mg/dL (ref 6–20)
CALCIUM ION: 1.14 mmol/L (ref 1.12–1.23)
Chloride: 101 mmol/L (ref 101–111)
Creatinine, Ser: 1.5 mg/dL — ABNORMAL HIGH (ref 0.61–1.24)
GLUCOSE: 91 mg/dL (ref 65–99)
HCT: 57 % — ABNORMAL HIGH (ref 39.0–52.0)
HEMOGLOBIN: 19.4 g/dL — AB (ref 13.0–17.0)
POTASSIUM: 4.7 mmol/L (ref 3.5–5.1)
Sodium: 138 mmol/L (ref 135–145)
TCO2: 27 mmol/L (ref 0–100)

## 2015-03-21 LAB — DIFFERENTIAL
Basophils Absolute: 0.1 10*3/uL (ref 0.0–0.1)
Basophils Relative: 1 %
EOS ABS: 0.1 10*3/uL (ref 0.0–0.7)
EOS PCT: 1 %
LYMPHS PCT: 32 %
Lymphs Abs: 2.7 10*3/uL (ref 0.7–4.0)
Monocytes Absolute: 1 10*3/uL (ref 0.1–1.0)
Monocytes Relative: 11 %
Neutro Abs: 4.7 10*3/uL (ref 1.7–7.7)
Neutrophils Relative %: 55 %

## 2015-03-21 LAB — PROTIME-INR
INR: 0.97 (ref 0.00–1.49)
Prothrombin Time: 13.1 seconds (ref 11.6–15.2)

## 2015-03-21 LAB — APTT: aPTT: 26 seconds (ref 24–37)

## 2015-03-21 MED ORDER — SODIUM CHLORIDE 0.9 % IV BOLUS (SEPSIS)
1000.0000 mL | Freq: Once | INTRAVENOUS | Status: AC
  Administered 2015-03-21: 1000 mL via INTRAVENOUS

## 2015-03-21 MED ORDER — PROCHLORPERAZINE EDISYLATE 5 MG/ML IJ SOLN
10.0000 mg | Freq: Once | INTRAMUSCULAR | Status: AC
  Administered 2015-03-21: 10 mg via INTRAVENOUS
  Filled 2015-03-21: qty 2

## 2015-03-21 MED ORDER — DIPHENHYDRAMINE HCL 50 MG/ML IJ SOLN
25.0000 mg | Freq: Once | INTRAMUSCULAR | Status: AC
  Administered 2015-03-21: 25 mg via INTRAVENOUS
  Filled 2015-03-21: qty 1

## 2015-03-21 NOTE — ED Provider Notes (Signed)
CSN: [id number]     Arrival date & time 03/21/15  2230 History   First MD Initiated Contact with Patient 03/21/15 2241     Chief Complaint  Patient presents with  . Code Stroke     (Consider location/radiation/quality/duration/timing/severity/associated sxs/prior Treatment) HPI  Patient is a 39 year old male with past mental history of migraines presenting today with headache and neck spasm and then "confusion". Patient was at home and had some numbness and his right arm and then a muscle spasm in his right neck and headache. His family member says that then he was having trouble speaking.  On arrival here he was intermittently having trouble speaking, however no focal neuro deficits. Could stroke was called.  Patient has hypertension, smoker   Past Medical History  Diagnosis Date  . Pancreatitis   . Chronic back pain   . Hypertension    Past Surgical History  Procedure Laterality Date  . Orthopedic surgery     No family history on file. Social History  Substance Use Topics  . Smoking status: Never Smoker   . Smokeless tobacco: Not on file  . Alcohol Use: No    Review of Systems  Constitutional: Negative for fever and activity change.  HENT: Negative for drooling and hearing loss.   Eyes: Negative for discharge and redness.  Respiratory: Negative for cough and shortness of breath.   Cardiovascular: Negative for chest pain.  Gastrointestinal: Negative for abdominal pain.  Genitourinary: Negative for dysuria and urgency.  Musculoskeletal: Negative for arthralgias.  Allergic/Immunologic: Negative for immunocompromised state.  Neurological: Positive for speech difficulty, numbness and headaches. Negative for seizures.  Psychiatric/Behavioral: Negative for behavioral problems and agitation.  All other systems reviewed and are negative.     Allergies  Review of patient's allergies indicates no known allergies.  Home Medications   Prior to Admission medications    Medication Sig Start Date End Date Taking? Authorizing Provider  albuterol (PROVENTIL HFA;VENTOLIN HFA) 108 (90 BASE) MCG/ACT inhaler Inhale into the lungs every 6 (six) hours as needed for wheezing or shortness of breath.   Yes Historical Provider, MD  HYDROcodone-acetaminophen (NORCO/VICODIN) 5-325 MG per tablet Take 1-2 tablets by mouth every 6 (six) hours as needed for moderate pain. 12/23/14  Yes Cathren Laine, MD  Multiple Vitamin (MULTIVITAMIN WITH MINERALS) TABS tablet Take 1 tablet by mouth daily.   Yes Historical Provider, MD  ALPRAZolam Prudy Feeler) 0.5 MG tablet Take 1 tablet (0.5 mg total) by mouth 3 (three) times daily as needed for anxiety. No driving when taking. Patient not taking: Reported on 12/23/2014 11/01/14   Cathren Laine, MD  amLODipine (NORVASC) 5 MG tablet Take 1 tablet (5 mg total) by mouth daily. Patient not taking: Reported on 12/23/2014 11/01/14   Cathren Laine, MD  amLODipine (NORVASC) 5 MG tablet Take 1 tablet (5 mg total) by mouth daily. Patient not taking: Reported on 03/21/2015 12/23/14   Cathren Laine, MD  bifidobacterium infantis (ALIGN) capsule Take 1 capsule twice daily while taking antibiotic. Do not take within 2 hours of taking the antibiotic. Patient not taking: Reported on 11/01/2014 07/06/14   Paula Libra, MD  chlorpheniramine-HYDROcodone [facility] PENNKINETIC ER) 10-8 MG/5ML LQCR Take 5 mLs by mouth every 12 (twelve) hours as needed. Patient not taking: Reported on 11/01/2014 06/24/14   Paula Libra, MD  cyclobenzaprine (FLEXERIL) 10 MG tablet Take 1 tablet (10 mg total) by mouth 2 (two) times daily as needed for muscle spasms. Patient not taking: Reported on 11/01/2014 08/04/14   Marlon Pel,  PA-C  ibuprofen (ADVIL,MOTRIN) 600 MG tablet Take 1 tablet (600 mg total) by mouth every 6 (six) hours as needed. Patient not taking: Reported on 12/23/2014 08/04/14   Marlon Pel, PA-C  oseltamivir (TAMIFLU) 75 MG capsule Take 1 capsule (75 mg total) by mouth every 12 (twelve)  hours. Patient not taking: Reported on 12/23/2014 11/01/14   Cathren Laine, MD  oxyCODONE-acetaminophen (PERCOCET/ROXICET) 5-325 MG per tablet Take 1 tablet by mouth every 6 (six) hours as needed for severe pain. Patient not taking: Reported on 11/01/2014 08/04/14   Marlon Pel, PA-C  zolpidem (AMBIEN) 5 MG tablet Take 1 tablet (5 mg total) by mouth at bedtime as needed for sleep. No driving when taking. Patient not taking: Reported on 12/23/2014 11/01/14   Cathren Laine, MD   BP 202/94 mmHg  Pulse 86  Resp 12  SpO2 100% Physical Exam  Constitutional: He is oriented to person, place, and time. He appears well-nourished.  HENT:  Head: Normocephalic.  Mouth/Throat: Oropharynx is clear and moist.  Eyes: Conjunctivae are normal.  Neck: No tracheal deviation present.  Cardiovascular: Normal rate.   Pulmonary/Chest: Effort normal. No stridor. No respiratory distress.  Abdominal: Soft. There is no tenderness. There is no guarding.  Musculoskeletal: Normal range of motion. He exhibits no edema.  Neurological: He is oriented to person, place, and time. No cranial nerve deficit.  Normal neurologic exam. No pronator drift. Normal strength. No aphasia. Patient able to repeat phrases. Patient able to identify objects. Pupils are equal.  Skin: Skin is warm and dry. No rash noted. He is not diaphoretic.  Psychiatric:  Patient anxious  Nursing note and vitals reviewed.   ED Course  Procedures (including critical care time) Labs Review Labs Reviewed  CBC - Abnormal; Notable for the following:    Hemoglobin 17.8 (*)    All other components within normal limits  I-STAT CHEM 8, ED - Abnormal; Notable for the following:    Creatinine, Ser 1.50 (*)    Hemoglobin 19.4 (*)    HCT 57.0 (*)    All other components within normal limits  DIFFERENTIAL  PROTIME-INR  APTT  COMPREHENSIVE METABOLIC PANEL  I-STAT TROPOININ, ED  CBG MONITORING, ED    Imaging Review Ct Head Wo Contrast  03/21/2015    CLINICAL DATA:  Code stroke. Difficulty talking. Worse headache of life, with photosensitivity and arm numbness. Initial encounter.  EXAM: CT HEAD WITHOUT CONTRAST  TECHNIQUE: Contiguous axial images were obtained from the base of the skull through the vertex without intravenous contrast.  COMPARISON:  None.  FINDINGS: There is no evidence of acute infarction, mass lesion, or intra- or extra-axial hemorrhage on CT.  The posterior fossa, including the cerebellum, brainstem and fourth ventricle, is within normal limits. The third and lateral ventricles, and basal ganglia are unremarkable in appearance. The cerebral hemispheres are symmetric in appearance, with normal gray-white differentiation. No mass effect or midline shift is seen.  There is no evidence of fracture; visualized osseous structures are unremarkable in appearance. The orbits are within normal limits. The paranasal sinuses and mastoid air cells are well-aerated. No significant soft tissue abnormalities are seen.  IMPRESSION: Unremarkable noncontrast CT of the head.  These results were called by telephone at the time of interpretation on 03/21/2015 at 10:59 pm to Dr. Hosie Poisson, who verbally acknowledged these results.   Electronically Signed   By: Roanna Raider M.D.   On: 03/21/2015 23:00   I have personally reviewed and evaluated these images and lab results  as part of my medical decision-making.   EKG Interpretation None      MDM   Final diagnoses:  None    Patient is a 39 year old male presenting as a code stroke. Patient was at home and he had a muscle spasm in his right arm and then headache following it. Patient had bizarre affect on arrival. He appeared very anxious. He was tearful and didn't want to get his blood drawn. Denies drug or alcohol use. This is a stroke versus complex migraine.  Patient has no neurologic deficits. Neurology saw patient who thinks the patient has a complex migraine. Patient refusing takeout studs in order  to get MRI.  Will treat as  Complex migraine plan to discharge home.    Montzerrat Brunell Randall An, MD 03/21/15 2313

## 2015-03-21 NOTE — ED Notes (Signed)
Pt from home for eval of right arm numbness, expressive aphasia and headache that started this evening at 2000. Per family, pt had trouble remembering previous conversations and was unsteady on his feet. No weakness noted to extremities at this time, pt does have aphasia and numbness to right side. MD made aware and order to activate code stroke.

## 2015-03-21 NOTE — Progress Notes (Signed)
Code Stroke called on 39 y.o male, presenting today at home with numbness and  tingling in right arm followed by severe acute onset headache with neck pain. Upon arrival to ED per private vehicle Pt developed slurred speech witnessed per Ed RN. Per wife at bedside Pt has also had trouble remembering previous conversations and was unsteady on his feet at home. LSN 2000. Labs drawn and Pt taken to CT scan STAT. CT negative for acute bleed per Dr. Wyatt Portela. PMH per medical record includes pancreatitis, chronic back pain, HTN and per wife he has occasional headaches. NIHSS completed yielding 0. Pt with transient confusion but able to answer all assessment questions appropriately. CBG 107. Per Dr. Wyatt Portela Pt to remain within TPA window until 0030, not for TPA at this time due to resolved symptoms. Pt for treatment of complicated migraine and observation in ED for now. MRI was considered however due to Pt refusing to remove earring studs Pt unable to go.

## 2015-03-21 NOTE — Consult Note (Signed)
Stroke Consult    Chief Complaint: right sided numbness HPI: Brett Herrera is an 39 y.o. male hx of HTN, migraines presenting with acute onset of right sided paresthesias followed by severe headache, neck spasm and confusion. Wife notes LSW 2000 when he noted tingling on his right side. Shortly after developed severe throbbing right sided headache with associated photophobia and nausea. Family notes his speech seemed slow and hesitant. Upon arrival to ED BP noted to be 231/141. Sensory deficits and speech improved though wife notes he still seems, "slow". Has had speech deficits with migraines in the past though wife notes never as severe as this.   CT head imaging reviewed and overall unremarkable.   Date last known well: 03/21/15 Time last known well: 2000 tPA Given: no, too mild to treat (NIHSS of 0) Modified Rankin: Rankin Score=0  Past Medical History  Diagnosis Date  . Pancreatitis   . Chronic back pain   . Hypertension     Past Surgical History  Procedure Laterality Date  . Orthopedic surgery      No family history on file. Social History:  reports that he has never smoked. He does not have any smokeless tobacco history on file. He reports that he does not drink alcohol or use illicit drugs.  Allergies: No Known Allergies   (Not in a hospital admission)  ROS: Out of a complete 14 system review, the patient complains of only the following symptoms, and all other reviewed systems are negative. +headache   Physical Examination: Filed Vitals:   03/21/15 2256  BP: 202/94  Pulse: 86  Resp: 12   Physical Exam  Constitutional: He appears well-developed and well-nourished.  Psych: Affect appropriate to situation Eyes: No scleral injection HENT: No OP obstrucion Head: Normocephalic.  Cardiovascular: Normal rate and regular rhythm.  Respiratory: Effort normal and breath sounds normal.  GI: Soft. Bowel sounds are normal. No distension. There is no tenderness.   Skin: WDI  Neurologic Examination: Mental Status: Alert, oriented, thought content appropriate. Slow hesitant speech though no signs of aphasia. No dysarthria  Some difficulty following multi-step commands Cranial Nerves: II: optic discs not visualized, visual fields grossly normal, pupils equal, round, reactive to light and accommodation III,IV, VI: ptosis not present, extra-ocular motions intact bilaterally V,VII: smile symmetric, facial light touch sensation normal bilaterally VIII: hearing normal bilaterally IX,X: gag reflex present XI: trapezius strength/neck flexion strength normal bilaterally XII: tongue strength normal  Motor: Right : Upper extremity    Left:     Upper extremity 5/5 deltoid       5/5 deltoid 5/5 biceps      5/5 biceps  5/5 triceps      5/5 triceps 5/5 hand grip      5/5 hand grip  Lower extremity     Lower extremity 5/5 hip flexor      5/5 hip flexor 5/5 quadricep      5/5 quadriceps  5/5 hamstrings     5/5 hamstrings 5/5 plantar flexion       5/5 plantar flexion 5/5 plantar extension     5/5 plantar extension Tone and bulk:normal tone throughout; no atrophy noted Sensory: Pinprick and light touch intact throughout, bilaterally Deep Tendon Reflexes: 2+ and symmetric throughout Plantars: Right: downgoing   Left: downgoing Cerebellar: normal finger-to-nose, normal rapid alternating movements and normal heel-to-shin test Gait: normal gait and station  Laboratory Studies:   Basic Metabolic Panel:  Recent Labs Lab 03/21/15 2250  NA 138  K 4.7  CL 101  GLUCOSE 91  BUN 15  CREATININE 1.50*    Liver Function Tests: No results for input(s): AST, ALT, ALKPHOS, BILITOT, PROT, ALBUMIN in the last 168 hours. No results for input(s): LIPASE, AMYLASE in the last 168 hours. No results for input(s): AMMONIA in the last 168 hours.  CBC:  Recent Labs Lab 03/21/15 2246 03/21/15 2250  WBC 8.7  --   NEUTROABS 4.7  --   HGB 17.8* 19.4*  HCT 51.8  57.0*  MCV 89.8  --   PLT 332  --     Cardiac Enzymes: No results for input(s): CKTOTAL, CKMB, CKMBINDEX, TROPONINI in the last 168 hours.  BNP: Invalid input(s): POCBNP  CBG: No results for input(s): GLUCAP in the last 168 hours.  Microbiology: No results found for this or any previous visit.  Coagulation Studies:  Recent Labs  03/21/15 2246  LABPROT 13.1  INR 0.97    Urinalysis: No results for input(s): COLORURINE, LABSPEC, PHURINE, GLUCOSEU, HGBUR, BILIRUBINUR, KETONESUR, PROTEINUR, UROBILINOGEN, NITRITE, LEUKOCYTESUR in the last 168 hours.  Invalid input(s): APPERANCEUR  Lipid Panel:  No results found for: CHOL, TRIG, HDL, CHOLHDL, VLDL, LDLCALC  HgbA1C: No results found for: HGBA1C  Urine Drug Screen:  No results found for: LABOPIA, COCAINSCRNUR, LABBENZ, AMPHETMU, THCU, LABBARB  Alcohol Level: No results for input(s): ETH in the last 168 hours.  Other results:  Imaging: Ct Head Wo Contrast  03/21/2015   CLINICAL DATA:  Code stroke. Difficulty talking. Worse headache of life, with photosensitivity and arm numbness. Initial encounter.  EXAM: CT HEAD WITHOUT CONTRAST  TECHNIQUE: Contiguous axial images were obtained from the base of the skull through the vertex without intravenous contrast.  COMPARISON:  None.  FINDINGS: There is no evidence of acute infarction, mass lesion, or intra- or extra-axial hemorrhage on CT.  The posterior fossa, including the cerebellum, brainstem and fourth ventricle, is within normal limits. The third and lateral ventricles, and basal ganglia are unremarkable in appearance. The cerebral hemispheres are symmetric in appearance, with normal gray-white differentiation. No mass effect or midline shift is seen.  There is no evidence of fracture; visualized osseous structures are unremarkable in appearance. The orbits are within normal limits. The paranasal sinuses and mastoid air cells are well-aerated. No significant soft tissue abnormalities are  seen.  IMPRESSION: Unremarkable noncontrast CT of the head.  These results were called by telephone at the time of interpretation on 03/21/2015 at 10:59 pm to Dr. Hosie Poisson, who verbally acknowledged these results.   Electronically Signed   By: Roanna Raider M.D.   On: 03/21/2015 23:00    Assessment: 39 y.o. male hx of migraines, HTN presenting with acute onset of right sided paresthesias in the setting of a severe headache. Code stroke activated. Discussed tPA with patient and spouse, decision made to hold on tPA due to NIHSS of 0. Presentation concerning for complex migraine. TIA/CVA in the differential though atypical presentation. Elevated BP also potentially playing a role. Patient unable to have MRI due to metal earrings that he cannot remove.   -migraine cocktail for symptomatic headache relief -gradual lowering of blood pressure -if headache control obtained can follow up with outpatient neurology   Elspeth Cho, DO [facility] [phone]  If 7pm- 7am, please page neurology on call as listed in AMION. 03/21/2015, 11:15 PM

## 2015-03-21 NOTE — ED Notes (Signed)
Pt states that he wants to leave AMA, pt educated on risks of leaving, MD notified, pt refusing MRI.

## 2015-05-20 ENCOUNTER — Emergency Department (HOSPITAL_BASED_OUTPATIENT_CLINIC_OR_DEPARTMENT_OTHER)
Admission: EM | Admit: 2015-05-20 | Discharge: 2015-05-20 | Disposition: A | Discharge status: Home / Self Care | Payer: Self-pay | Attending: Emergency Medicine | Admitting: Emergency Medicine

## 2015-05-20 ENCOUNTER — Emergency Department (HOSPITAL_BASED_OUTPATIENT_CLINIC_OR_DEPARTMENT_OTHER): Payer: Self-pay

## 2015-05-20 ENCOUNTER — Encounter (HOSPITAL_BASED_OUTPATIENT_CLINIC_OR_DEPARTMENT_OTHER): Payer: Self-pay

## 2015-05-20 DIAGNOSIS — S76911A Strain of unspecified muscles, fascia and tendons at thigh level, right thigh, initial encounter: Secondary | ICD-10-CM | POA: Insufficient documentation

## 2015-05-20 DIAGNOSIS — I10 Essential (primary) hypertension: Secondary | ICD-10-CM | POA: Insufficient documentation

## 2015-05-20 DIAGNOSIS — M25852 Other specified joint disorders, left hip: Secondary | ICD-10-CM

## 2015-05-20 DIAGNOSIS — Y9389 Activity, other specified: Secondary | ICD-10-CM | POA: Insufficient documentation

## 2015-05-20 DIAGNOSIS — M2682 Posterior soft tissue impingement: Secondary | ICD-10-CM | POA: Insufficient documentation

## 2015-05-20 DIAGNOSIS — Z8719 Personal history of other diseases of the digestive system: Secondary | ICD-10-CM | POA: Insufficient documentation

## 2015-05-20 DIAGNOSIS — X58XXXA Exposure to other specified factors, initial encounter: Secondary | ICD-10-CM | POA: Insufficient documentation

## 2015-05-20 DIAGNOSIS — G8929 Other chronic pain: Secondary | ICD-10-CM | POA: Insufficient documentation

## 2015-05-20 DIAGNOSIS — Z79899 Other long term (current) drug therapy: Secondary | ICD-10-CM | POA: Insufficient documentation

## 2015-05-20 DIAGNOSIS — Y9289 Other specified places as the place of occurrence of the external cause: Secondary | ICD-10-CM | POA: Insufficient documentation

## 2015-05-20 DIAGNOSIS — T148XXA Other injury of unspecified body region, initial encounter: Secondary | ICD-10-CM

## 2015-05-20 DIAGNOSIS — Y998 Other external cause status: Secondary | ICD-10-CM | POA: Insufficient documentation

## 2015-05-20 MED ORDER — OXYCODONE-ACETAMINOPHEN 5-325 MG PO TABS
1.0000 | ORAL_TABLET | Freq: Once | ORAL | Status: AC
  Administered 2015-05-20: 1 via ORAL
  Filled 2015-05-20: qty 1

## 2015-05-20 MED ORDER — OXYCODONE-ACETAMINOPHEN 5-325 MG PO TABS: 1.0000 | ORAL_TABLET | Freq: Four times a day (QID) | ORAL | Status: DC | PRN

## 2015-05-20 MED ORDER — IBUPROFEN 600 MG PO TABS: 600.0000 mg | ORAL_TABLET | Freq: Four times a day (QID) | ORAL | Status: DC | PRN

## 2015-05-20 MED ORDER — KETOROLAC TROMETHAMINE 60 MG/2ML IM SOLN
60.0000 mg | Freq: Once | INTRAMUSCULAR | Status: AC
  Administered 2015-05-20: 60 mg via INTRAMUSCULAR
  Filled 2015-05-20: qty 2

## 2015-05-20 NOTE — Discharge Instructions (Signed)
You were seen today and likely have a groin strain. However, your x-rays do show impingement of the hips left worse than right. This may just be a normal variant. He will be given sports medicine follow-up.  Groin Strain A groin strain (also called a groin pull) is an injury to the muscles or tendon on the upper inner part of the thigh. These muscles are called the adductor muscles or groin muscles. They are responsible for moving the leg across the body. A muscle strain occurs when a muscle is overstretched and some muscle fibers are torn. A groin strain can range from mild to severe depending on how many muscle fibers are affected and whether the muscle fibers are partially or completely torn.  Groin strains usually occur during exercise or participation in sports. The injury often happens when a sudden, violent force is placed on a muscle, stretching the muscle too far. A strain is more likely to occur when your muscles are not warmed up or if you are not properly conditioned. Depending on the severity of the groin strain, recovery time may vary from a few weeks to several weeks. Severe injuries often require 4-6 weeks for recovery. In these cases, complete healing can take 4-5 months.  CAUSES   Stretching the groin muscles too far or too suddenly, often during side-to-side motion with an abrupt change in direction.  Putting repeated stress on the groin muscles over a long period of time.  Performing vigorous activity without properly stretching the groin muscles beforehand. SYMPTOMS   Pain and tenderness in the groin area. This begins as sharp pain and persists as a dull ache.  Popping or snapping feeling when the injury occurs (for severe strains).  Swelling or bruising.  Muscle spasms.  Weakness in the leg.  Stiffness in the groin area with decreased ability to move the affected muscles. DIAGNOSIS  Your caregiver will perform a physical exam to diagnose a groin strain. You will be  asked about your symptoms and how the injury occurred. X-rays are sometimes needed to rule out a broken bone or cartilage problems. Your caregiver may order a CT scan or MRI if a complete muscle tear is suspected. TREATMENT  A groin strain will often heal on its own. Your caregiver may prescribe medicines to help manage pain and swelling (anti-inflammatory medicine). You may be told to use crutches for the first few days to minimize your pain. HOME CARE INSTRUCTIONS   Rest. Do not use the strained muscle if it causes pain.  Put ice on the injured area.  Put ice in a plastic bag.  Place a towel between your skin and the bag.  Leave the ice on for 15-20 minutes, every 2-3 hours. Do this for the first 2 days after the injury.  Only take over-the-counter or prescription medicines as directed by your caregiver.  Wrap the injured area with an elastic bandage as directed by your caregiver.  Keep the injured leg raised (elevated).  Walk, stretch, and perform range-of-motion exercises to improve blood flow to the injured area. Only perform these activities if you can do so without any pain. To prevent muscle strains:  Warm up before exercise.  Develop proper conditioning and strength in the groin muscles. SEEK IMMEDIATE MEDICAL CARE IF:   You have increased pain or swelling in the affected area.   Your symptoms are not improving or are getting worse. MAKE SURE YOU:   Understand these instructions.  Will watch your condition.  Will get  help right away if you are not doing well or get worse.   This information is not intended to replace advice given to you by your health care provider. Make sure you discuss any questions you have with your health care provider.   Document Released: 02/02/2004 Document Revised: 05/23/2012 Document Reviewed: 02/08/2012 [employer] Interactive Patient Education [employer]2016 [employer]

## 2015-05-20 NOTE — ED Provider Notes (Signed)
CSN: [id number]     Arrival date & time 05/20/15  0027 History   First MD Initiated Contact with Patient 05/20/15 0104     Chief Complaint  Patient presents with  . Groin Injury     (Consider location/radiation/quality/duration/timing/severity/associated sxs/prior Treatment) HPI   This a 39 year old male who presents with left groin pain. Patient reports that he was working out earlier today when he noted a "pop" in his left groin. He has sustained similar injuries in the past and thinks he may have pulled a muscle. He took 800 mg of ibuprofen at home with minimal relief. Currently he rates his pain at 10 out of 10. He reports limping with walking. Denies other injury.  Past Medical History  Diagnosis Date  . Pancreatitis   . Chronic back pain   . Hypertension    Past Surgical History  Procedure Laterality Date  . Orthopedic surgery     No family history on file. Social History  Substance Use Topics  . Smoking status: Never Smoker   . Smokeless tobacco: None  . Alcohol Use: No    Review of Systems  Musculoskeletal: Positive for gait problem.       Left groin pain  All other systems reviewed and are negative.     Allergies  Review of patient's allergies indicates no known allergies.  Home Medications   Prior to Admission medications   Medication Sig Start Date End Date Taking? Authorizing Provider  Multiple Vitamin (MULTIVITAMIN WITH MINERALS) TABS tablet Take 1 tablet by mouth daily.   Yes Historical Provider, MD  albuterol (PROVENTIL HFA;VENTOLIN HFA) 108 (90 BASE) MCG/ACT inhaler Inhale into the lungs every 6 (six) hours as needed for wheezing or shortness of breath.    Historical Provider, MD  ALPRAZolam Prudy Feeler) 0.5 MG tablet Take 1 tablet (0.5 mg total) by mouth 3 (three) times daily as needed for anxiety. No driving when taking. Patient not taking: Reported on 12/23/2014 11/01/14   Cathren Laine, MD  amLODipine (NORVASC) 5 MG tablet Take 1 tablet (5 mg total) by  mouth daily. Patient not taking: Reported on 12/23/2014 11/01/14   Cathren Laine, MD  amLODipine (NORVASC) 5 MG tablet Take 1 tablet (5 mg total) by mouth daily. Patient not taking: Reported on 03/21/2015 12/23/14   Cathren Laine, MD  bifidobacterium infantis (ALIGN) capsule Take 1 capsule twice daily while taking antibiotic. Do not take within 2 hours of taking the antibiotic. Patient not taking: Reported on 11/01/2014 07/06/14   Paula Libra, MD  chlorpheniramine-HYDROcodone [facility] PENNKINETIC ER) 10-8 MG/5ML LQCR Take 5 mLs by mouth every 12 (twelve) hours as needed. Patient not taking: Reported on 11/01/2014 06/24/14   Paula Libra, MD  cyclobenzaprine (FLEXERIL) 10 MG tablet Take 1 tablet (10 mg total) by mouth 2 (two) times daily as needed for muscle spasms. Patient not taking: Reported on 11/01/2014 08/04/14   Marlon Pel, PA-C  HYDROcodone-acetaminophen (NORCO/VICODIN) 5-325 MG per tablet Take 1-2 tablets by mouth every 6 (six) hours as needed for moderate pain. 12/23/14   Cathren Laine, MD  ibuprofen (ADVIL,MOTRIN) 600 MG tablet Take 1 tablet (600 mg total) by mouth every 6 (six) hours as needed. 05/20/15   Shon Baton, MD  oseltamivir (TAMIFLU) 75 MG capsule Take 1 capsule (75 mg total) by mouth every 12 (twelve) hours. Patient not taking: Reported on 12/23/2014 11/01/14   Cathren Laine, MD  oxyCODONE-acetaminophen (PERCOCET/ROXICET) 5-325 MG tablet Take 1-2 tablets by mouth every 6 (six) hours as needed for severe  pain. 05/20/15   Shon Batonourtney F Horton, MD  zolpidem (AMBIEN) 5 MG tablet Take 1 tablet (5 mg total) by mouth at bedtime as needed for sleep. No driving when taking. Patient not taking: Reported on 12/23/2014 11/01/14   Cathren LaineKevin Steinl, MD   BP 188/101 mmHg  Pulse 89  Temp(Src) 98.8 F (37.1 C) (Oral)  Resp 18  Ht 6\' 4"  (1.93 m)  Wt 230 lb (104.327 kg)  BMI 28.01 kg/m2  SpO2 98% Physical Exam  Constitutional: He is oriented to person, place, and time. He appears well-developed and  well-nourished. No distress.  HENT:  Head: Normocephalic and atraumatic.  Cardiovascular: Normal rate and regular rhythm.   Pulmonary/Chest: Effort normal. No respiratory distress.  Musculoskeletal: Normal range of motion. He exhibits no edema.  Normal range of motion of left hip, no obvious deformities, tenderness to palpation in the left inguinal region, no spasm noted  Neurological: He is alert and oriented to person, place, and time.  Skin: Skin is warm and dry.  Psychiatric: He has a normal mood and affect.  Nursing note and vitals reviewed.   ED Course  Procedures (including critical care time) Labs Review Labs Reviewed - No data to display  Imaging Review Dg Pelvis 1-2 Views  05/20/2015  CLINICAL DATA:  Severe left inguinal pain after exercise 9 hours ago. EXAM: PELVIS - 1-2 VIEW COMPARISON:  None. FINDINGS: There are severe chronic changes of both hips, worse on the left. There are morphologic changes of the femoral heads consistent with CAM type femoral acetabular impingement, worse on the left. There is arthritic change of both hips with subchondral sclerosis and osteophyte formation. No bone lesion or bony destruction. No fracture or other acute bony abnormality. IMPRESSION: CAM femoral acetabular impingement, left worse than right. Moderately severe left hip arthritis and milder right hip arthritis. No acute findings. Electronically Signed   By: Ellery Plunkaniel R Mitchell M.D.   On: 05/20/2015 01:58   I have personally reviewed and evaluated these images and lab results as part of my medical decision-making.   EKG Interpretation None      MDM   Final diagnoses:  Muscle strain  Femoroacetabular impingement of left hip    Patient presents with left groin pain. Likely muscle strain; however given how proximal the pain is in the inguinal region, will obtain pelvic films to rule out avulsion fracture. Pelvic films are negative for fracture but does show femoral acetabular  impingement left greater than right. This is of unclear clinical significance. Will treat as a muscle strain and have patient follow-up with sports medicine.  After history, exam, and medical workup I feel the patient has been appropriately medically screened and is safe for discharge home. Pertinent diagnoses were discussed with the patient. Patient was given return precautions.     Shon Batonourtney F Horton, MD 05/20/15 [phone]

## 2015-05-20 NOTE — ED Notes (Signed)
BP taken x 2

## 2015-05-20 NOTE — ED Notes (Signed)
Pt reports was doing a leg press working out and thinks he pulled something in his left groin.  Limping in triage due to pain

## 2015-07-30 ENCOUNTER — Encounter (HOSPITAL_BASED_OUTPATIENT_CLINIC_OR_DEPARTMENT_OTHER): Payer: Self-pay | Admitting: *Deleted

## 2015-07-30 ENCOUNTER — Emergency Department (HOSPITAL_BASED_OUTPATIENT_CLINIC_OR_DEPARTMENT_OTHER)
Admission: EM | Admit: 2015-07-30 | Discharge: 2015-07-30 | Disposition: A | Discharge status: Home / Self Care | Payer: Self-pay | Attending: Emergency Medicine | Admitting: Emergency Medicine

## 2015-07-30 DIAGNOSIS — Z87828 Personal history of other (healed) physical injury and trauma: Secondary | ICD-10-CM | POA: Insufficient documentation

## 2015-07-30 DIAGNOSIS — Z8739 Personal history of other diseases of the musculoskeletal system and connective tissue: Secondary | ICD-10-CM | POA: Insufficient documentation

## 2015-07-30 DIAGNOSIS — I10 Essential (primary) hypertension: Secondary | ICD-10-CM | POA: Insufficient documentation

## 2015-07-30 DIAGNOSIS — Z8719 Personal history of other diseases of the digestive system: Secondary | ICD-10-CM | POA: Insufficient documentation

## 2015-07-30 DIAGNOSIS — G8929 Other chronic pain: Secondary | ICD-10-CM | POA: Insufficient documentation

## 2015-07-30 DIAGNOSIS — R1032 Left lower quadrant pain: Secondary | ICD-10-CM | POA: Insufficient documentation

## 2015-07-30 DIAGNOSIS — Z79899 Other long term (current) drug therapy: Secondary | ICD-10-CM | POA: Insufficient documentation

## 2015-07-30 MED ORDER — HYDROCODONE-ACETAMINOPHEN 5-325 MG PO TABS
2.0000 | ORAL_TABLET | Freq: Once | ORAL | Status: AC
  Administered 2015-07-30: 2 via ORAL
  Filled 2015-07-30: qty 2

## 2015-07-30 MED ORDER — HYDROCODONE-ACETAMINOPHEN 5-325 MG PO TABS: 1.0000 | ORAL_TABLET | Freq: Four times a day (QID) | ORAL | Status: DC | PRN

## 2015-07-30 NOTE — ED Notes (Signed)
Groin injury 6 months ago. States he comes here when the pain is out of control. Stabbing pain with radiation into his left leg.

## 2015-07-30 NOTE — ED Provider Notes (Signed)
CSN: [id number]     Arrival date & time 07/30/15  2158 History  By signing my name below, I, Brett Herrera, attest that this documentation has been prepared under the direction and in the presence of Paula Libra, MD. Electronically Signed: Phillis Herrera, ED Scribe. 07/30/2015. 11:08 PM.   Chief Complaint  Patient presents with  . Groin Pain   The history is provided by the patient. No language interpreter was used.  HPI Comments: Brett Herrera is a 40 y.o. Male with a hx of HTN, chronic back pain and pancreatitis who presents to the Emergency Department complaining of constant, gradually worsening stabbing left groin pain onset 6 months ago, worsening significantly one week ago. He initially injured the area doing leg presses, then heard a "pop." Pt states that the pain is now radiating to his left buttock, thigh and top of knee. He reports that the pain feels like, "someone is stabbing me" in the back of my leg. It sometimes is like I have no power in that leg. It took me 25 minutes to walk across [employer] and I feel like I can't walk like I normally can." He states that the pain will wake him from sleep and rates his pain 10/10 at the worst, exacerbated by movement. Pt has called orthopedics but has not been treated due to lack of insurance. He has had imaging done and was diagnosed with bilateral CAM femoral acetabular impingement, L>R. Pt has been taking 600-800 mg Tylenol for the pain to no relief. He denies recent injury, but has been on his feet more often at work. Pt denies fever, chills, numbness or weakness.  Past Medical History  Diagnosis Date  . Pancreatitis   . Chronic back pain   . Hypertension    Past Surgical History  Procedure Laterality Date  . Orthopedic surgery     No family history on file. Social History  Substance Use Topics  . Smoking status: Never Smoker   . Smokeless tobacco: None  . Alcohol Use: No    Review of Systems 10 Systems reviewed and all are  negative for acute change except as noted in the HPI.  Allergies  Review of patient's allergies indicates no known allergies.  Home Medications   Prior to Admission medications   Medication Sig Start Date End Date Taking? Authorizing Provider  HYDROcodone-acetaminophen (NORCO/VICODIN) 5-325 MG tablet Take 1-2 tablets by mouth every 6 (six) hours as needed (for pain). 07/30/15   Paula Libra, MD  Multiple Vitamin (MULTIVITAMIN WITH MINERALS) TABS tablet Take 1 tablet by mouth daily.    Historical Provider, MD   BP 169/131 mmHg  Pulse 86  Temp(Src) 98.3 F (36.8 C) (Oral)  Resp 20  Ht  (1.905 m)  Wt 235 lb (106.595 kg)  BMI 29.37 kg/m2  SpO2 99%   Physical Exam General: Well-developed, well-nourished male in no acute distress; appearance consistent with age of record HENT: normocephalic; atraumatic Eyes: pupils equal, round and reactive to light; extraocular muscles intact Neck: supple Heart: regular rate and rhythm; no murmurs, rubs or gallops Lungs: clear to auscultation bilaterally Abdomen: soft; nondistended; nontender; no masses or hepatosplenomegaly; bowel sounds present Extremities: No deformity; full range of motion; pulses normal; point tenderness of the medial left thigh just inferior to the groin fold, with pain reproduced on passive ROM of the left hip Neurologic: Awake, alert and oriented; motor function intact in all extremities and symmetric; no facial droop Skin: Warm and dry Psychiatric: Normal mood and  affect  ED Course  Procedures (including critical care time)   MDM  We will refer to the orthopedist on-call who is legally obligated under HIPAA to see the patient. We will treat with a short course of narcotics. He was advised that we are no longer treating chronic pain in the ED.  Final diagnoses:  Groin pain, left   I personally performed the services described in this documentation, which was scribed in my presence. The recorded information has been  reviewed and is accurate.    Paula Libra, MD 07/30/15 2322

## 2015-07-30 NOTE — ED Notes (Signed)
C/o left groin pain x 6 months,  States was doing leg presses and heard pop  Pain is getting worse and changing,  Pain is now radiating into left hip and thigh   States call ortho but would not see him due to lack of insurance

## 2015-09-21 ENCOUNTER — Emergency Department (HOSPITAL_BASED_OUTPATIENT_CLINIC_OR_DEPARTMENT_OTHER)
Admission: EM | Admit: 2015-09-21 | Discharge: 2015-09-21 | Disposition: A | Discharge status: Home / Self Care | Payer: Self-pay | Attending: Emergency Medicine | Admitting: Emergency Medicine

## 2015-09-21 ENCOUNTER — Encounter (HOSPITAL_BASED_OUTPATIENT_CLINIC_OR_DEPARTMENT_OTHER): Payer: Self-pay | Admitting: Emergency Medicine

## 2015-09-21 DIAGNOSIS — Z79899 Other long term (current) drug therapy: Secondary | ICD-10-CM | POA: Insufficient documentation

## 2015-09-21 DIAGNOSIS — M546 Pain in thoracic spine: Secondary | ICD-10-CM

## 2015-09-21 DIAGNOSIS — S29012A Strain of muscle and tendon of back wall of thorax, initial encounter: Secondary | ICD-10-CM | POA: Insufficient documentation

## 2015-09-21 DIAGNOSIS — Y9289 Other specified places as the place of occurrence of the external cause: Secondary | ICD-10-CM | POA: Insufficient documentation

## 2015-09-21 DIAGNOSIS — X500XXA Overexertion from strenuous movement or load, initial encounter: Secondary | ICD-10-CM | POA: Insufficient documentation

## 2015-09-21 DIAGNOSIS — Y998 Other external cause status: Secondary | ICD-10-CM | POA: Insufficient documentation

## 2015-09-21 DIAGNOSIS — G8929 Other chronic pain: Secondary | ICD-10-CM | POA: Insufficient documentation

## 2015-09-21 DIAGNOSIS — Y9389 Activity, other specified: Secondary | ICD-10-CM | POA: Insufficient documentation

## 2015-09-21 DIAGNOSIS — Z8719 Personal history of other diseases of the digestive system: Secondary | ICD-10-CM | POA: Insufficient documentation

## 2015-09-21 DIAGNOSIS — I10 Essential (primary) hypertension: Secondary | ICD-10-CM

## 2015-09-21 MED ORDER — OXYCODONE-ACETAMINOPHEN 5-325 MG PO TABS
1.0000 | ORAL_TABLET | Freq: Once | ORAL | Status: AC
  Administered 2015-09-21: 1 via ORAL
  Filled 2015-09-21: qty 1

## 2015-09-21 MED ORDER — NAPROXEN 500 MG PO TABS: 500.0000 mg | ORAL_TABLET | Freq: Two times a day (BID) | ORAL | Status: DC

## 2015-09-21 MED ORDER — CYCLOBENZAPRINE HCL 10 MG PO TABS: 10.0000 mg | ORAL_TABLET | Freq: Two times a day (BID) | ORAL | Status: AC | PRN

## 2015-09-21 MED ORDER — HYDROCODONE-ACETAMINOPHEN 5-325 MG PO TABS: 2.0000 | ORAL_TABLET | ORAL | Status: DC | PRN

## 2015-09-21 MED FILL — CYCLOBENZAPRINE 10 MG TAB: 10 | 10 days supply | Qty: 20 | Fill #0

## 2015-09-21 MED FILL — HYDROCODON-APAP 5-325: 5-325 | 2 days supply | Qty: 10 | Fill #0

## 2015-09-21 MED FILL — NAPROXEN 500 MG TABLET: 500 | 15 days supply | Qty: 30 | Fill #0

## 2015-09-21 NOTE — Discharge Instructions (Signed)
Back Pain, Adult °Back pain is very common in adults. The cause of back pain is rarely dangerous and the pain often gets better over time. The cause of your back pain may not be known. Some common causes of back pain include: °· Strain of the muscles or ligaments supporting the spine. °· Wear and tear (degeneration) of the spinal disks. °· Arthritis. °· Direct injury to the back. °For many people, back pain may return. Since back pain is rarely dangerous, most people can learn to manage this condition on their own. °HOME CARE INSTRUCTIONS °Watch your back pain for any changes. The following actions may help to lessen any discomfort you are feeling: °· Remain active. It is stressful on your back to sit or stand in one place for long periods of time. Do not sit, drive, or stand in one place for more than 30 minutes at a time. Take short walks on even surfaces as soon as you are able. Try to increase the length of time you walk each day. °· Exercise regularly as directed by your health care provider. Exercise helps your back heal faster. It also helps avoid future injury by keeping your muscles strong and flexible. °· Do not stay in bed. Resting more than 1-2 days can delay your recovery. °· Pay attention to your body when you bend and lift. The most comfortable positions are those that put less stress on your recovering back. Always use proper lifting techniques, including: °· Bending your knees. °· Keeping the load close to your body. °· Avoiding twisting. °· Find a comfortable position to sleep. Use a firm mattress and lie on your side with your knees slightly bent. If you lie on your back, put a pillow under your knees. °· Avoid feeling anxious or stressed. Stress increases muscle tension and can worsen back pain. It is important to recognize when you are anxious or stressed and learn ways to manage it, such as with exercise. °· Take medicines only as directed by your health care provider. Over-the-counter  medicines to reduce pain and inflammation are often the most helpful. Your health care provider may prescribe muscle relaxant drugs. These medicines help dull your pain so you can more quickly return to your normal activities and healthy exercise. °· Apply ice to the injured area: °· Put ice in a plastic bag. °· Place a towel between your skin and the bag. °· Leave the ice on for 20 minutes, 2-3 times a day for the first 2-3 days. After that, ice and heat may be alternated to reduce pain and spasms. °· Maintain a healthy weight. Excess weight puts extra stress on your back and makes it difficult to maintain good posture. °SEEK MEDICAL CARE IF: °· You have pain that is not relieved with rest or medicine. °· You have increasing pain going down into the legs or buttocks. °· You have pain that does not improve in one week. °· You have night pain. °· You lose weight. °· You have a fever or chills. °SEEK IMMEDIATE MEDICAL CARE IF:  °· You develop new bowel or bladder control problems. °· You have unusual weakness or numbness in your arms or legs. °· You develop nausea or vomiting. °· You develop abdominal pain. °· You feel faint. °  °This information is not intended to replace advice given to you by your health care provider. Make sure you discuss any questions you have with your health care provider. °  °Document Released: 06/06/2005 Document Revised: 06/27/2014 Document Reviewed: 10/08/2013 °Elsevier Interactive Patient Education ©2016 [employer]  Hypertension Hypertension, commonly called high blood pressure, is when the force of blood pumping through your arteries is too strong. Your arteries are the blood vessels that carry blood from your heart throughout your body. A blood pressure reading consists of a higher number over a lower number, such as 110/72. The higher number (systolic) is the pressure inside your arteries when your heart pumps. The lower number (diastolic) is the pressure inside your arteries  when your heart relaxes. Ideally you want your blood pressure below 120/80. Hypertension forces your heart to work harder to pump blood. Your arteries may become narrow or stiff. Having untreated or uncontrolled hypertension can cause heart attack, stroke, kidney disease, and other problems. RISK FACTORS Some risk factors for high blood pressure are controllable. Others are not.  Risk factors you cannot control include:   Race. You may be at higher risk if you are African American.  Age. Risk increases with age.  Gender. Men are at higher risk than women before age 42 years. After age 42, women are at higher risk than men. Risk factors you can control include:  Not getting enough exercise or physical activity.  Being overweight.  Getting too much fat, sugar, calories, or salt in your diet.  Drinking too much alcohol. SIGNS AND SYMPTOMS Hypertension does not usually cause signs or symptoms. Extremely high blood pressure (hypertensive crisis) may cause headache, anxiety, shortness of breath, and nosebleed. DIAGNOSIS To check if you have hypertension, your health care provider will measure your blood pressure while you are seated, with your arm held at the level of your heart. It should be measured at least twice using the same arm. Certain conditions can cause a difference in blood pressure between your right and left arms. A blood pressure reading that is higher than normal on one occasion does not mean that you need treatment. If it is not clear whether you have high blood pressure, you may be asked to return on a different day to have your blood pressure checked again. Or, you may be asked to monitor your blood pressure at home for 1 or more weeks. TREATMENT Treating high blood pressure includes making lifestyle changes and possibly taking medicine. Living a healthy lifestyle can help lower high blood pressure. You may need to change some of your habits. Lifestyle changes may  include:  Following the DASH diet. This diet is high in fruits, vegetables, and whole grains. It is low in salt, red meat, and added sugars.  Keep your sodium intake below 2,300 mg per day.  Getting at least 30-45 minutes of aerobic exercise at least 4 times per week.  Losing weight if necessary.  Not smoking.  Limiting alcoholic beverages.  Learning ways to reduce stress. Your health care provider may prescribe medicine if lifestyle changes are not enough to get your blood pressure under control, and if one of the following is true:  You are 6818-42 years of age and your systolic blood pressure is above 140.  You are 42 years of age or older, and your systolic blood pressure is above 150.  Your diastolic blood pressure is above 90.  You have diabetes, and your systolic blood pressure is over 140 or your diastolic blood pressure is over 90.  You have kidney disease and your blood pressure is above 140/90.  You have heart disease and your blood pressure is above 140/90. Your personal target blood pressure may vary depending on your medical conditions, your age, and other factors.  HOME CARE INSTRUCTIONS  Have your blood pressure rechecked as directed by your health care provider.   Take medicines only as directed by your health care provider. Follow the directions carefully. Blood pressure medicines must be taken as prescribed. The medicine does not work as well when you skip doses. Skipping doses also puts you at risk for problems.  Do not smoke.   Monitor your blood pressure at home as directed by your health care provider. SEEK MEDICAL CARE IF:   You think you are having a reaction to medicines taken.  You have recurrent headaches or feel dizzy.  You have swelling in your ankles.  You have trouble with your vision. SEEK IMMEDIATE MEDICAL CARE IF:  You develop a severe headache or confusion.  You have unusual weakness, numbness, or feel faint.  You have severe  chest or abdominal pain.  You vomit repeatedly.  You have trouble breathing. MAKE SURE YOU:   Understand these instructions.  Will watch your condition.  Will get help right away if you are not doing well or get worse.   This information is not intended to replace advice given to you by your health care provider. Make sure you discuss any questions you have with your health care provider.   Apply ice to affected area. Take pain medication as needed. Encourage you to follow up with a primary care doctor for reevaluation of your high blood pressure. Return to the emergency department if you experience severe worsening of your symptoms, severe headache, blurry vision, neck pain or stiffness, also bowel or bladder control, numbness or tingling in both of your lower extremities, difficulty breathing.

## 2015-09-21 NOTE — ED Notes (Signed)
Patient states that he was trying to lift a heavy piece of furniture this am. The patient reports that he felt his back pull at the time. He reports that he continued to work but now his pain is worse

## 2015-09-21 NOTE — ED Provider Notes (Signed)
CSN: [id number]     Arrival date & time 09/21/15  1517 History   First MD Initiated Contact with Patient 09/21/15 1658     Chief Complaint  Patient presents with  . Back Pain     (Consider location/radiation/quality/duration/timing/severity/associated sxs/prior Treatment) HPI   Brett Herrera is a 40 y.o M Who presents to the ED complaining of back pain. Patient states this morning he was unloading a furniture truck and was carrying a large heavy piece of furniture. He states the furniture started to slip so he twisted his back in an attempt to grab it and felt a sharp pain in his right middle back. Patient states it felt like "someone was stabbing me with a knife between my ribs". Patient states that he continued to work and on the furniture for the rest of the day which causes back pain to be aggravated. Patient states that now when he coughs or sneezes it causes his pain to increase. He has tried taking ibuprofen without relief of his pain. Patient has history of chronic back pain. He states this is much different. He denies bowel or bladder incontinence, saddle anesthesia, fever, dysuria.  Past Medical History  Diagnosis Date  . Pancreatitis   . Chronic back pain   . Hypertension    Past Surgical History  Procedure Laterality Date  . Orthopedic surgery     History reviewed. No pertinent family history. Social History  Substance Use Topics  . Smoking status: Never Smoker   . Smokeless tobacco: None  . Alcohol Use: No    Review of Systems  All other systems reviewed and are negative.     Allergies  Review of patient's allergies indicates no known allergies.  Home Medications   Prior to Admission medications   Medication Sig Start Date End Date Taking? Authorizing Provider  HYDROcodone-acetaminophen (NORCO/VICODIN) 5-325 MG tablet Take 1-2 tablets by mouth every 6 (six) hours as needed (for pain). 07/30/15   Paula Libra, MD  Multiple Vitamin (MULTIVITAMIN WITH MINERALS)  TABS tablet Take 1 tablet by mouth daily.    Historical Provider, MD   BP 169/110 mmHg  Pulse 102  Temp(Src) 98.8 F (37.1 C) (Oral)  Resp 16  Ht  (1.905 m)  Wt 104.327 kg  BMI 28.75 kg/m2  SpO2 97% Physical Exam  Constitutional: He is oriented to person, place, and time. He appears well-developed and well-nourished. No distress.  HENT:  Head: Normocephalic and atraumatic.  Eyes: Conjunctivae are normal. Right eye exhibits no discharge. Left eye exhibits no discharge. No scleral icterus.  Cardiovascular: Normal rate.   Pulmonary/Chest: Effort normal.  Musculoskeletal:       Thoracic back: He exhibits pain. He exhibits no bony tenderness and no spasm.       Back:  No midline spinal tenderness. FROM of C, T, L spine. No step offs or obvious bony deformities.   Neurological: He is alert and oriented to person, place, and time. Coordination normal.  Skin: Skin is warm and dry. No rash noted. He is not diaphoretic. No erythema. No pallor.  Psychiatric: He has a normal mood and affect. His behavior is normal.  Nursing note and vitals reviewed.   ED Course  Procedures (including critical care time) Labs Review Labs Reviewed - No data to display  Imaging Review No results found. I have personally reviewed and evaluated these images and lab results as part of my medical decision-making.   EKG Interpretation None      MDM  Final diagnoses:  Right-sided thoracic back pain  Essential hypertension   40 year old male presents to the ED with back pain after straining it while lifting heavy furniture today. Suspect muscle strain. Doubt rib fracture as mechanism of injury does not indicate this and he does not have any bony tenderness. No neurological deficits on exam. No concern for cauda equina or other life-threatening causes of back pain. Patient is ambulatory in ED without difficulty. We will provide a short course of pain medication for relief as well as muscle relaxers  and anti-inflammatories. Rice precautions recommended. Appropriate follow-up and return precautions indicated in discharge instructions. Patient had elevated blood pressure upon presentation, 169/110. Patient states that he has been off his blood pressure medications for 20 years as it caused him to have erectile dysfunction. He currently does not have insurance and has not been able to see a primary care provider. He denies blurry vision, headache, vomiting, neck pain or stiffness. Discussed with patient about daily resources that may assist him in obtaining a primary care doctor as well as consequences of long-standing hypertension. Patient expresses complete understanding.     Lester KinsmanSamantha Tripp KalevaDowless, PA-C 09/21/15 1745  Arby BarretteMarcy Pfeiffer, MD 09/22/15 Moses Manners0025

## 2015-10-16 ENCOUNTER — Emergency Department (HOSPITAL_COMMUNITY)
Admission: EM | Admit: 2015-10-16 | Discharge: 2015-10-16 | Disposition: A | Discharge status: Home / Self Care | Payer: Self-pay | Attending: Emergency Medicine | Admitting: Emergency Medicine

## 2015-10-16 ENCOUNTER — Emergency Department (HOSPITAL_COMMUNITY): Payer: Self-pay

## 2015-10-16 ENCOUNTER — Encounter (HOSPITAL_COMMUNITY): Payer: Self-pay | Admitting: Emergency Medicine

## 2015-10-16 DIAGNOSIS — Y9289 Other specified places as the place of occurrence of the external cause: Secondary | ICD-10-CM | POA: Insufficient documentation

## 2015-10-16 DIAGNOSIS — X501XXA Overexertion from prolonged static or awkward postures, initial encounter: Secondary | ICD-10-CM | POA: Insufficient documentation

## 2015-10-16 DIAGNOSIS — Z791 Long term (current) use of non-steroidal anti-inflammatories (NSAID): Secondary | ICD-10-CM | POA: Insufficient documentation

## 2015-10-16 DIAGNOSIS — Z79899 Other long term (current) drug therapy: Secondary | ICD-10-CM | POA: Insufficient documentation

## 2015-10-16 DIAGNOSIS — G8929 Other chronic pain: Secondary | ICD-10-CM | POA: Insufficient documentation

## 2015-10-16 DIAGNOSIS — I10 Essential (primary) hypertension: Secondary | ICD-10-CM | POA: Insufficient documentation

## 2015-10-16 DIAGNOSIS — S8392XA Sprain of unspecified site of left knee, initial encounter: Secondary | ICD-10-CM | POA: Insufficient documentation

## 2015-10-16 DIAGNOSIS — Y9389 Activity, other specified: Secondary | ICD-10-CM | POA: Insufficient documentation

## 2015-10-16 DIAGNOSIS — Z8719 Personal history of other diseases of the digestive system: Secondary | ICD-10-CM | POA: Insufficient documentation

## 2015-10-16 DIAGNOSIS — R52 Pain, unspecified: Secondary | ICD-10-CM

## 2015-10-16 DIAGNOSIS — Y998 Other external cause status: Secondary | ICD-10-CM | POA: Insufficient documentation

## 2015-10-16 MED ORDER — MORPHINE SULFATE (PF) 4 MG/ML IV SOLN
4.0000 mg | Freq: Once | INTRAVENOUS | Status: AC
  Administered 2015-10-16: 4 mg via INTRAVENOUS
  Filled 2015-10-16: qty 1

## 2015-10-16 MED ORDER — HYDROCODONE-ACETAMINOPHEN 5-325 MG PO TABS: ORAL_TABLET | ORAL | Status: DC

## 2015-10-16 MED ORDER — HYDROCODONE-ACETAMINOPHEN 5-325 MG PO TABS
1.0000 | ORAL_TABLET | Freq: Once | ORAL | Status: AC
  Administered 2015-10-16: 1 via ORAL
  Filled 2015-10-16: qty 1

## 2015-10-16 NOTE — ED Notes (Signed)
Agree with PA assessment 

## 2015-10-16 NOTE — Discharge Instructions (Signed)
Your MRI today does not show any acute abnormalities.   For pain control please take ibuprofen (also known as Motrin or Advil)  (this is normally 4 over the counter pills) 3 times a day  for 5 days. Take with food to minimize stomach irritation.  Take vicodin for breakthrough pain, do not drink alcohol, drive, care for children or do other critical tasks while taking vicodin.  Keep the knee immobilizer on at all times including when sleeping except when bathing. Do not stop using the knee immobilizer until you are cleared by your orthopedist.  Do not hesitate to return to the emergency room for any new, worsening or concerning symptoms.  Please obtain primary care using resource guide below. Let them know that you were seen in the emergency room and that they will need to obtain records for further outpatient management.   Knee Sprain A knee sprain is a tear in one of the strong, fibrous tissues that connect the bones (ligaments) in your knee. The severity of the sprain depends on how much of the ligament is torn. The tear can be either partial or complete. CAUSES  Often, sprains are a result of a fall or injury. The force of the impact causes the fibers of your ligament to stretch too much. This excess tension causes the fibers of your ligament to tear. SIGNS AND SYMPTOMS  You may have some loss of motion in your knee. Other symptoms include:  Bruising.  Pain in the knee area.  Tenderness of the knee to the touch.  Swelling. DIAGNOSIS  To diagnose a knee sprain, your health care provider will physically examine your knee. Your health care provider may also suggest an X-ray exam of your knee to make sure no bones are broken. TREATMENT  If your ligament is only partially torn, treatment usually involves keeping the knee in a fixed position (immobilization) or bracing your knee for activities that require movement for several weeks. To do this, your health care provider will apply a  bandage, cast, or splint to keep your knee from moving and to support your knee during movement until it heals. For a partially torn ligament, the healing process usually takes 4-6 weeks. If your ligament is completely torn, depending on which ligament it is, you may need surgery to reconnect the ligament to the bone or reconstruct it. After surgery, a cast or splint may be applied and will need to stay on your knee for 4-6 weeks while your ligament heals. HOME CARE INSTRUCTIONS  Keep your injured knee elevated to decrease swelling.  To ease pain and swelling, apply ice to the injured area:  Put ice in a plastic bag.  Place a towel between your skin and the bag.  Leave the ice on for 20 minutes, 2-3 times a day.  Only take medicine for pain as directed by your health care provider.  Do not leave your knee unprotected until pain and stiffness go away (usually 4-6 weeks).  If you have a cast or splint, do not allow it to get wet. If you have been instructed not to remove it, cover it with a plastic bag when you shower or bathe. Do not swim.  Your health care provider may suggest exercises for you to do during your recovery to prevent or limit permanent weakness and stiffness. SEEK IMMEDIATE MEDICAL CARE IF:  Your cast or splint becomes damaged.  Your pain becomes worse.  You have significant pain, swelling, or numbness below the cast or  splint. MAKE SURE YOU:  Understand these instructions.  Will watch your condition.  Will get help right away if you are not doing well or get worse.   This information is not intended to replace advice given to you by your health care provider. Make sure you discuss any questions you have with your health care provider.   Document Released: 06/06/2005 Document Revised: 06/27/2014 Document Reviewed: 01/16/2013 Elsevier Interactive Patient Education 2016 [employer]  [employer] Resource Guide Financial Assistance The United Ways 211 is a  great source of information about community services available.  Access by dialing 2-1-1 from anywhere in [place], or by website -  PooledIncome.plwww.nc211.org.   Other Local Resources (Updated 06/2015)  Financial Assistance   Services    Phone Number and Address  [facility]  Low-cost medical care - 1st and 3rd Saturday of every month  Must not qualify for public or private insurance and must have limited income [phone] [address] [place], [place]    [place] Department of Social Services  Child care  Emergency assistance for housing and Kimberly[employer]Clarkutilities  Food stamps  Medicaid [phone] [address] [place], [place] [address]   [facility]  Low-cost medical care for children, communicable diseases, sexually-transmitted diseases, immunizations, maternity care, womens health and family planning [phone] [address] [place], [place] [address]  [facility]   Medication assistance for [place] residents  Must meet income requirements [phone] [address] [place], [place].    [facility] Social Services  Child care  Emergency assistance for housing and Kimberly-Clarkutilities  Food stamps  Medicaid [phone] [address] [place], [place] [address]  [facility]   Low-cost medical care,   Monday through Friday, 9 am to 6 pm.   Accepts Medicare/Medicaid, and self-pay [phone] [address] [place], [place] [address]  [facility]  Low-cost medical care - Monday through Friday, 8:30 am - 5:30 pm  Accepts Medicaid and self-pay [phone] [address] [place], [place] [address]   [facility]  Primary medical care, including for those with sickle cell disease  Accepts Medicare, Medicaid, insurance and self-pay [phone] [address] [place], [place]  [facility]   Primary medical care  Accepts Medicare, [place], insurance and self-pay [phone] [address] [place], [place] [address]   [facility] Department of Social Services  Child care  Emergency assistance for housing and Kimberly-Clarkutilities  Food stamps  Medicaid [id number] [address] [place], [place] [address]  [facility] Department of Health and [employer]Human Services  Child care  Emergency assistance for housing and Kimberly-Clarkutilities  Food stamps  Medicaid [phone] [address] [place], [place] [address]   [facility]  Medication assistance for [facility] [place] residents with no insurance only  Must have a primary care doctor [phone] [address] [place], [place]  [facility]   Primary medical care  Penn[place]s Medicare, [place]icaid, insurance  [phone] [address] [place], [place]  [facility]   Medication assistance [phone]  [facility]   Primary medical care  Accepts Medicare, [place], insurance and self-pay [phone] [address] [place], [place] [address]  [facility]   Primary medical care  Accepts Medicare, [place], insurance and self-pay [phone] [address] [place], [place] [address]  [facility]  For [facility] residents between the ages of 60 and 87 who do not have any form of health insurance, Medicare, [place], or [place] benefits.  Services are provided free of charge to uninsured patients who fall within federal poverty guidelines.    Hours: Tuesdays and Thursdays, 4:15 - 8 pm [phone] [address] [place], [place] [address]  [facility]     Primary medical care  Dental care  Nutritional counseling  Pharmacy  Accepts Medicaid, Medicare, most insurance.  Fees are adjusted based on ability to  pay.   [phone] [facility] [address] [place], [place]  [phone] [facility] [address] [place], [place]  [phone] [facility] [place], [place]  [phone] [facility], [address], [place]  [phone] [facility] [address] [place], [place]  [facility]  Womens health and family planning [phone] [place] [place], [place]  [facility] Department of Social Services  Child care  Emergency assistance for housing and [employer]  Medicaid [phone] [address], [place], [place] [address]   [facility]    Ages 30 and older  Hours: Mondays and Thursdays, 7:00 am - 9:00 am Patients are seen on a first come, first served basis. [phone], [phone] [place], [place]  [facility] Division of Social Services  Child care  Emergency assistance for housing and [employer]  Medicaid [phone] [address] [place], [place] [address]  [facility]  Medication assistance  Rental assistance  Food pantry  Medication assistance  Housing assistance  Emergency food distribution  Utility assistance [phone] [address] [place], [place]  [phone]  [address] [place], [place] [address] Hours: Tuesdays and Thursdays from 9am - 12 noon by appointment only  [phone] [address] [place], [place] [address]  [facility] - Lanae Boast   Accepts private insurance, [place], and [place].  Payment is based on a sliding scale for those without insurance.  Hours: Mondays, Tuesdays and Thursdays, 8:30 am - 5:30 pm.   [phone] [address] [place], [place]  [facility] - Family Medicine at [facility], [place], and  [place].  Payment is based on a sliding scale for those without insurance. [phone] [address] [place], [place]  [facility] - Pediatrics at E. Scientist, research (physical sciences), [employer], and [place].  Payment is based on a sliding scale for those without insurance [phone] [address], [employer], [place]  [facility] - Pediatrics at [employer], [place], and [place].  Payment is based on a sliding scale for those without insurance. [phone] [address] [place], [place]  [facility] - Pediatrics at [facility], [place], and [place].  Payment is based on a sliding scale for those without insurance. [phone], [phone] [place], [place].    [facility]  Maternity care.  Accepts Medicaid and self-pay. [phone] [address] [place], [place]

## 2015-10-16 NOTE — ED Notes (Signed)
Pt states he twisted his knee yesterday afternoon while push mowing his yard. Pt c/o of pain while putting weight on his left knee.

## 2015-10-16 NOTE — ED Provider Notes (Signed)
CSN: [id number]     Arrival date & time 10/16/15  1127 History  By signing my name below, I, Brett Herrera, attest that this documentation has been prepared under the direction and in the presence of [employer] Corporationicole Julita Ozbun, PA-C. Electronically Signed: Ronney LionSuzanne Herrera, ED Scribe. 10/16/2015. 1:33 PM.    Chief Complaint  Patient presents with  . Knee Injury   The history is provided by the patient. No language interpreter was used.    HPI Comments: Brett Herrera is a 40 y.o. male with a history of chronic back pain, who presents to the Emergency Department complaining of sudden-onset, constant, 10/10, aching, sharp left knee pain after twisting his knee and hearing a "pop" when mowing the lawn around a ditch yesterday. Patient states that if he turns a certain way while walking, his leg will give out. His wife notes he will fall occasionally but he denies any injuries from the falls, including head injury or LOC. Patient states he had taken ibuprofen this morning at home with minimal relief. He states letting his leg hang off a chair alleviates his pain. Patient reports he is not able to straighten his leg fully secondary to his pain. He reports he has an orthopedist from a prior foot surgery.   Past Medical History  Diagnosis Date  . Pancreatitis   . Chronic back pain   . Hypertension    Past Surgical History  Procedure Laterality Date  . Orthopedic surgery     No family history on file. Social History  Substance Use Topics  . Smoking status: Never Smoker   . Smokeless tobacco: None  . Alcohol Use: No    Review of Systems A complete 10 system review of systems was obtained and all systems are negative except as noted in the HPI and PMH.    Allergies  Review of patient's allergies indicates no known allergies.  Home Medications   Prior to Admission medications   Medication Sig Start Date End Date Taking? Authorizing Provider  cyclobenzaprine (FLEXERIL) 10 MG tablet Take 1 tablet (10 mg  total) by mouth 2 (two) times daily as needed for muscle spasms. 09/21/15   Brett Tripp Dowless, PA-C  HYDROcodone-acetaminophen (NORCO/VICODIN) 5-325 MG tablet Take 1-2 tablets by mouth every 6 (six) hours as needed (for pain). 07/30/15   Brett Molpus, MD  HYDROcodone-acetaminophen (NORCO/VICODIN) 5-325 MG tablet Take 2 tablets by mouth every 4 (four) hours as needed. 09/21/15   Brett Tripp Dowless, PA-C  Multiple Vitamin (MULTIVITAMIN WITH MINERALS) TABS tablet Take 1 tablet by mouth daily.    Historical Provider, MD  naproxen (NAPROSYN) 500 MG tablet Take 1 tablet (500 mg total) by mouth 2 (two) times daily. 09/21/15   Brett Tripp Dowless, PA-C   BP 179/110 mmHg  Pulse 94  Temp(Src) 98 F (36.7 C) (Oral)  Resp 16  Ht 6\' 3"  (1.905 m)  Wt 230 lb (104.327 kg)  BMI 28.75 kg/m2  SpO2 100% Physical Exam  Constitutional: He is oriented to person, place, and time. He appears well-developed and well-nourished. No distress.  HENT:  Head: Normocephalic and atraumatic.  Mouth/Throat: Oropharynx is clear and moist.  Eyes: Conjunctivae and EOM are normal. Pupils are equal, round, and reactive to light.  Neck: Neck supple. No tracheal deviation present.  Cardiovascular: Normal rate, regular rhythm and intact distal pulses.   Pulmonary/Chest: Effort normal and breath sounds normal. No respiratory distress. He has no wheezes. He has no rales. He exhibits no tenderness.  Abdominal: Soft. He  exhibits no distension and no mass. There is no tenderness. There is no rebound and no guarding.  Musculoskeletal: Normal range of motion. He exhibits tenderness.  Left knee:   No deformity, erythema or abrasions. FROM. No effusion or crepitance. Anterior and posterior drawer show no abnormal laxity. Stable to valgus and varus stress. Joint lines are non-tender. Neurovascularly intact. Pt ambulates with non-antalgic gait.    Neurological: He is alert and oriented to person, place, and time.  Skin: Skin is warm and  dry.  Psychiatric: He has a normal mood and affect. His behavior is normal.  Nursing note and vitals reviewed.   ED Course  Procedures (including critical care time)  DIAGNOSTIC STUDIES: Oxygen Saturation is 100% on RA, normal by my interpretation.    COORDINATION OF CARE: 1:07 PM - Discussed treatment plan with pt at bedside which includes knee immobilizer, crutches, and pain medication, as pt's wife is driving home today. Pt verbalized understanding and agreed to plan.   Imaging Review Dg Knee Complete 4 Views Left  10/16/2015  CLINICAL DATA:  Recent fall while mowing yard with knee injury, initial encounter EXAM: LEFT KNEE - COMPLETE 4+ VIEW COMPARISON:  None. FINDINGS: Degenerative changes are noted in all 3 joint compartments. No joint effusion is seen. No acute fracture is noted. IMPRESSION: Degenerative change without acute abnormality. Electronically Signed   By: Alcide Clever M.D.   On: 10/16/2015 12:35   I have personally reviewed and evaluated these images and lab results as part of my medical decision-making.  MDM   Final diagnoses:  Pain  Knee sprain, left, initial encounter    Filed Vitals:   10/16/15 1134  BP: 179/110  Pulse: 94  Temp: 98 F (36.7 C)  TempSrc: Oral  Resp: 16  Height:  (1.905 m)  Weight: 104.327 kg  SpO2: 100%    Medications  morphine 4 MG/ML injection 4 mg (4 mg Intravenous Given 10/16/15 1337)    Brett Herrera is 40 y.o. male presenting with knee pain after patient was the knee yesterday, states that it feels unstable any solid multiple falls since the incident. On my exam there is no gross instability to the knee, patient is concerned because he states he will not be able to follow with orthopedics because he can't afford. MRI ordered for further evaluation. MRI negative for meniscal or ligamentous tear. Patient will be placed in a knee brace for stability and given crutches.   Evaluation does not show pathology that would require  ongoing emergent intervention or inpatient treatment. Pt is hemodynamically stable and mentating appropriately. Discussed findings and plan with patient/guardian, who agrees with care plan. All questions answered. Return precautions discussed and outpatient follow up given.   New Prescriptions   HYDROCODONE-ACETAMINOPHEN (NORCO/VICODIN) 5-325 MG TABLET    Take 1-2 tablets by mouth every 6 hours as needed for pain and/or cough.        Wynetta Emery, PA-C 10/16/15 1642  Leta Baptist, MD 10/29/15 [phone]

## 2016-01-19 ENCOUNTER — Emergency Department (HOSPITAL_BASED_OUTPATIENT_CLINIC_OR_DEPARTMENT_OTHER)
Admission: EM | Admit: 2016-01-19 | Discharge: 2016-01-19 | Disposition: A | Discharge status: Home / Self Care | Payer: Self-pay | Attending: Emergency Medicine | Admitting: Emergency Medicine

## 2016-01-19 ENCOUNTER — Emergency Department (HOSPITAL_BASED_OUTPATIENT_CLINIC_OR_DEPARTMENT_OTHER): Payer: Self-pay

## 2016-01-19 ENCOUNTER — Encounter (HOSPITAL_BASED_OUTPATIENT_CLINIC_OR_DEPARTMENT_OTHER): Payer: Self-pay | Admitting: *Deleted

## 2016-01-19 DIAGNOSIS — S92902A Unspecified fracture of left foot, initial encounter for closed fracture: Secondary | ICD-10-CM | POA: Insufficient documentation

## 2016-01-19 DIAGNOSIS — Y999 Unspecified external cause status: Secondary | ICD-10-CM | POA: Insufficient documentation

## 2016-01-19 DIAGNOSIS — I10 Essential (primary) hypertension: Secondary | ICD-10-CM | POA: Insufficient documentation

## 2016-01-19 DIAGNOSIS — Y929 Unspecified place or not applicable: Secondary | ICD-10-CM | POA: Insufficient documentation

## 2016-01-19 DIAGNOSIS — S82892A Other fracture of left lower leg, initial encounter for closed fracture: Secondary | ICD-10-CM

## 2016-01-19 DIAGNOSIS — X501XXA Overexertion from prolonged static or awkward postures, initial encounter: Secondary | ICD-10-CM | POA: Insufficient documentation

## 2016-01-19 DIAGNOSIS — Y939 Activity, unspecified: Secondary | ICD-10-CM | POA: Insufficient documentation

## 2016-01-19 DIAGNOSIS — S8292XA Unspecified fracture of left lower leg, initial encounter for closed fracture: Secondary | ICD-10-CM | POA: Insufficient documentation

## 2016-01-19 MED ORDER — HYDROCODONE-ACETAMINOPHEN 5-325 MG PO TABS: ORAL_TABLET | ORAL | 0 refills | Status: DC

## 2016-01-19 MED ORDER — OXYCODONE-ACETAMINOPHEN 5-325 MG PO TABS
2.0000 | ORAL_TABLET | Freq: Once | ORAL | Status: AC
  Administered 2016-01-19: 2 via ORAL
  Filled 2016-01-19: qty 2

## 2016-01-19 MED ORDER — NAPROXEN 500 MG PO TABS: 500.0000 mg | ORAL_TABLET | Freq: Two times a day (BID) | ORAL | 0 refills | Status: DC

## 2016-01-19 NOTE — ED Notes (Signed)
Pt verbalizes understanding of d/c instructions and denies any further needs at this time. 

## 2016-01-19 NOTE — Discharge Instructions (Signed)
You have been evaluated for your injury.  It appears you have a broken left ankle and a small avulsion fracture of your left foot. Do not bear weight on affected ankle.  Use crutches and follow up with orthopedist for further care.

## 2016-01-19 NOTE — ED Provider Notes (Signed)
[facility] Provider Note   CSN: [id number] Arrival date & time: 01/19/16  1857  First Provider Contact:  First MD Initiated Contact with Patient 01/19/16 1930  By signing my name below, I, Rosario Adie, attest that this documentation has been prepared under the direction and in the presence of Fayrene Helper, PA-C.  Electronically Signed: Rosario Adie, ED Scribe. 01/19/16. 7:37 PM.  History   Chief Complaint Chief Complaint  Patient presents with  . Ankle Injury   The history is provided by the patient. No language interpreter was used.   HPI Comments: Brett Herrera is a 40 y.o. male with a PMHx of HTN, who presents to the Emergency Department complaining of sudden onset, gradually worsening, constant left ankle pain onset yesterday PM PTA. States his pain radiates up the posterior portion of his lower left leg from his ankle to his mid-calf. Pt reports that he was walking forward down a step when he inverted his left ankle, sustaining his pain. States he heard a sudden "pop" during the incident. Pt reports associated edema to the area. His pain is worsened with bearing weight, and with movement of the joint. He has taken Ibuprofen PTA, with minimal relief of his pain. Pt was followed by an Ortho ~7 years ago for a PSHx to the right ankle s/p sports related injury. Denies any other associated symptoms.   Past Medical History:  Diagnosis Date  . Chronic back pain   . Hypertension   . Pancreatitis    There are no active problems to display for this patient.  Past Surgical History:  Procedure Laterality Date  . ORTHOPEDIC SURGERY      Home Medications    Prior to Admission medications   Medication Sig Start Date End Date Taking? Authorizing Provider  cyclobenzaprine (FLEXERIL) 10 MG tablet Take 1 tablet (10 mg total) by mouth 2 (two) times daily as needed for muscle spasms. 09/21/15   Samantha Tripp Dowless, PA-C  HYDROcodone-acetaminophen (NORCO/VICODIN)  5-325 MG tablet Take 1-2 tablets by mouth every 6 hours as needed for pain and/or cough. 10/16/15   Nicole Pisciotta, PA-C  Multiple Vitamin (MULTIVITAMIN WITH MINERALS) TABS tablet Take 1 tablet by mouth daily.    Historical Provider, MD  naproxen (NAPROSYN) 500 MG tablet Take 1 tablet (500 mg total) by mouth 2 (two) times daily. 09/21/15   Samantha Tripp Dowless, PA-C   Family History No family history on file.  Social History Social History  Substance Use Topics  . Smoking status: Never Smoker  . Smokeless tobacco: Never Used  . Alcohol use No   Allergies   Review of patient's allergies indicates no known allergies.  Review of Systems Review of Systems  Constitutional: Negative for fever.  Musculoskeletal: Positive for arthralgias (left ankle) and joint swelling.  Psychiatric/Behavioral: Negative for confusion.   Physical Exam Updated Vital Signs BP (!) 167/120   Pulse 89   Temp 98.4 F (36.9 C) (Oral)   Resp 16   Ht 6\' 4"  (1.93 m)   Wt 235 lb (106.6 kg)   SpO2 98%   BMI 28.61 kg/m   Physical Exam  Constitutional: He appears well-developed and well-nourished.  HENT:  Head: Normocephalic.  Eyes: Conjunctivae are normal.  Cardiovascular: Normal rate.   Pulmonary/Chest: Effort normal. No respiratory distress.  Abdominal: He exhibits no distension.  Musculoskeletal: He exhibits edema and tenderness.  Left ankle: Lateral malleolus exhibits significant tenderness w/ associated swelling and ecchymosis extending to the mid-foot. TTP to  the fifth metatarsal and posterior malleolus regions. Pedal pulses are palpable w/ brisk capillary refill. Decreased ROM secondary to pain.   Neurological: He is alert.  Skin: Skin is warm and dry.  Psychiatric: He has a normal mood and affect. His behavior is normal.  Nursing note and vitals reviewed.  ED Treatments / Results  DIAGNOSTIC STUDIES: Oxygen Saturation is 98% on RA, normal by my interpretation.   COORDINATION OF CARE: 7:35  PM-Discussed next steps with pt. Pt verbalized understanding and is agreeable with the plan.   Radiology Dg Ankle Complete Left  Result Date: 01/19/2016 CLINICAL DATA:  Twisting left ankle injury last night on steps. EXAM: LEFT ANKLE COMPLETE - 3+ VIEW COMPARISON:  None. FINDINGS: Ankle mortise is within normal. There are 2 small adjacent curvilinear bony fragments adjacent the lateral midfoot bones on the AP view possibly chip fracture from adjacent calcaneus or cuboid. Small inferior calcaneal spur. IMPRESSION: Two small adjacent curvilinear bone fragments over the lateral midfoot on the AP view which may represent chip fractures from calcaneus or cuboid bone. Left foot series may be helpful for further evaluation. Electronically Signed   By: Elberta Fortis M.D.   On: 01/19/2016 19:25   Dg Foot Complete Left  Result Date: 01/19/2016 CLINICAL DATA:  Inverted LEFT ankle last night walking down 8 steps, LEFT ankle and foot pain with swelling and redness at the proximal tarsals, heard a pop during the incident, abnormal ankle radiographs EXAM: LEFT FOOT - COMPLETE 3+ VIEW COMPARISON:  None FINDINGS: Osseous mineralization normal. Joint spaces preserved. Two small calcific densities are seen adjacent to the lateral margin of the distal calcaneus, uncertain if represent an avulsion fragments or are related to accessory ossification center. No additional fracture, dislocation, or bone destruction. IMPRESSION: Two tiny calcific density is seen adjacent to the lateral margin of the distal calcaneus, indeterminate in character, question related to accessory ossification center or tiny fracture fragments; correlation for pain/tenderness at this site recommended. Electronically Signed   By: Ulyses Southward M.D.   On: 01/19/2016 20:03    Procedures Procedures (including critical care time)  Medications Ordered in ED Medications - No data to display  Initial Impression / Assessment and Plan / ED Course  I have  reviewed the triage vital signs and the nursing notes.  Pertinent labs & imaging results that were available during my care of the patient were reviewed by me and considered in my medical decision making (see chart for details).  Clinical Course   BP (!) 167/120   Pulse 89   Temp 98.4 F (36.9 C) (Oral)   Resp 16   Ht  (1.93 m)   Wt 106.6 kg   SpO2 98%   BMI 28.61 kg/m   Pt present s/p left ankle injury w/ associated pain and swelling. Pt xray significant for avulsion fx of L ankle and L foot.  This is a closed injury.  Pt is NVI. Pain managed in ED. Pt advised to follow up with orthopedics for further care.  Ankle splint and crutches provided.  Recommend non weight bearing.  Patient will be dc home & is agreeable with above plan.  Final Clinical Impressions(s) / ED Diagnoses   Final diagnoses:  Closed left ankle fracture, initial encounter  Foot fracture, left, closed, initial encounter    New Prescriptions Current Discharge Medication List     I personally performed the services described in this documentation, which was scribed in my presence. The recorded information has been  reviewed and is accurate.       Fayrene Helper, PA-C 01/19/16 2027    Melene Plan, DO 01/19/16 2047

## 2016-01-19 NOTE — ED Triage Notes (Signed)
Twisted his left ankle last night. Swelling and pain.

## 2016-04-20 IMAGING — CR DG CHEST 2V
2 series · 2 of 2 positions shown · non-contrast
Comparison: Chest radiograph performed 08/04/2014

CLINICAL DATA: Acute onset of productive cough. Initial encounter.
Chest radiograph performed 08/04/2014

EXAM:
CHEST  2 VIEW

[chest pa]
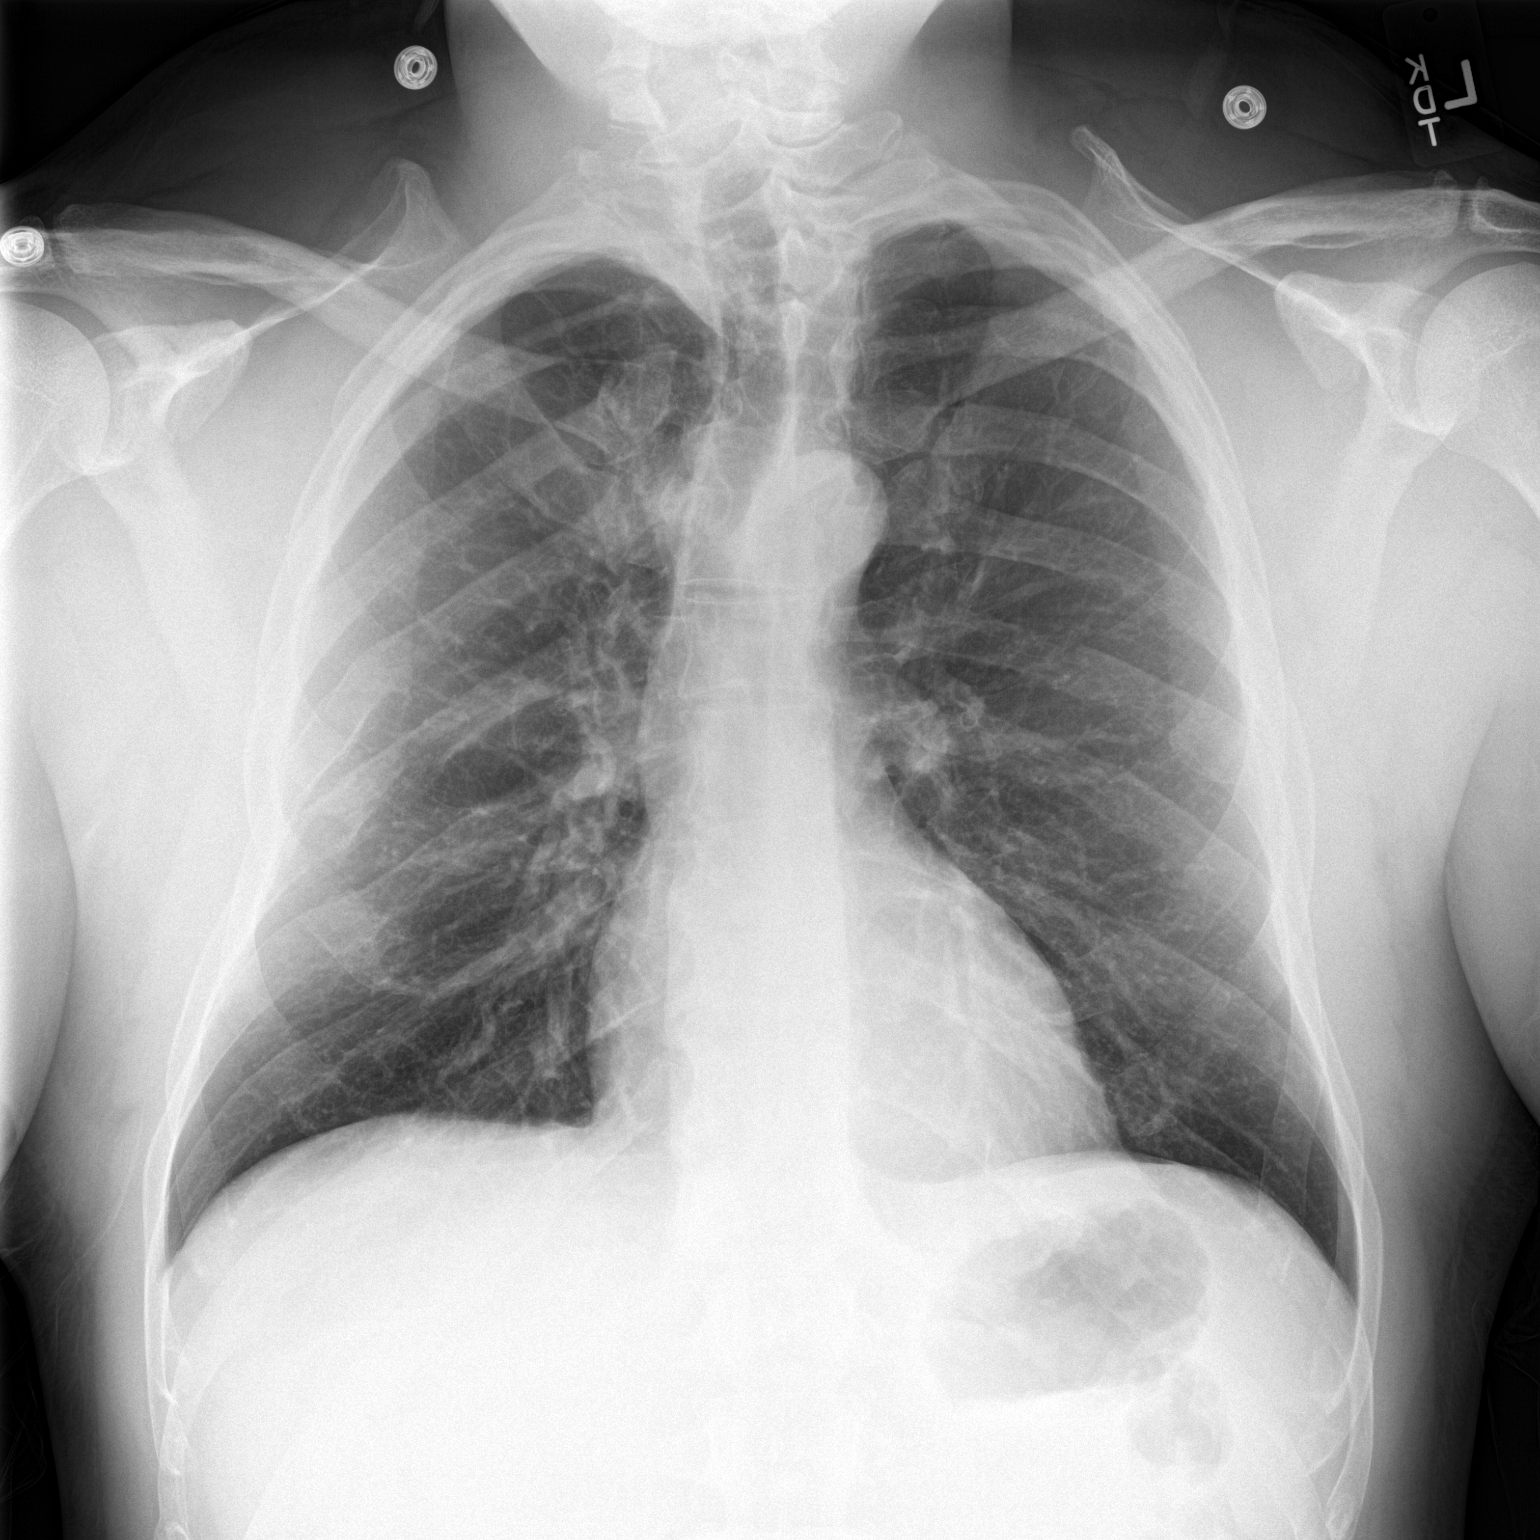

[chest lat]
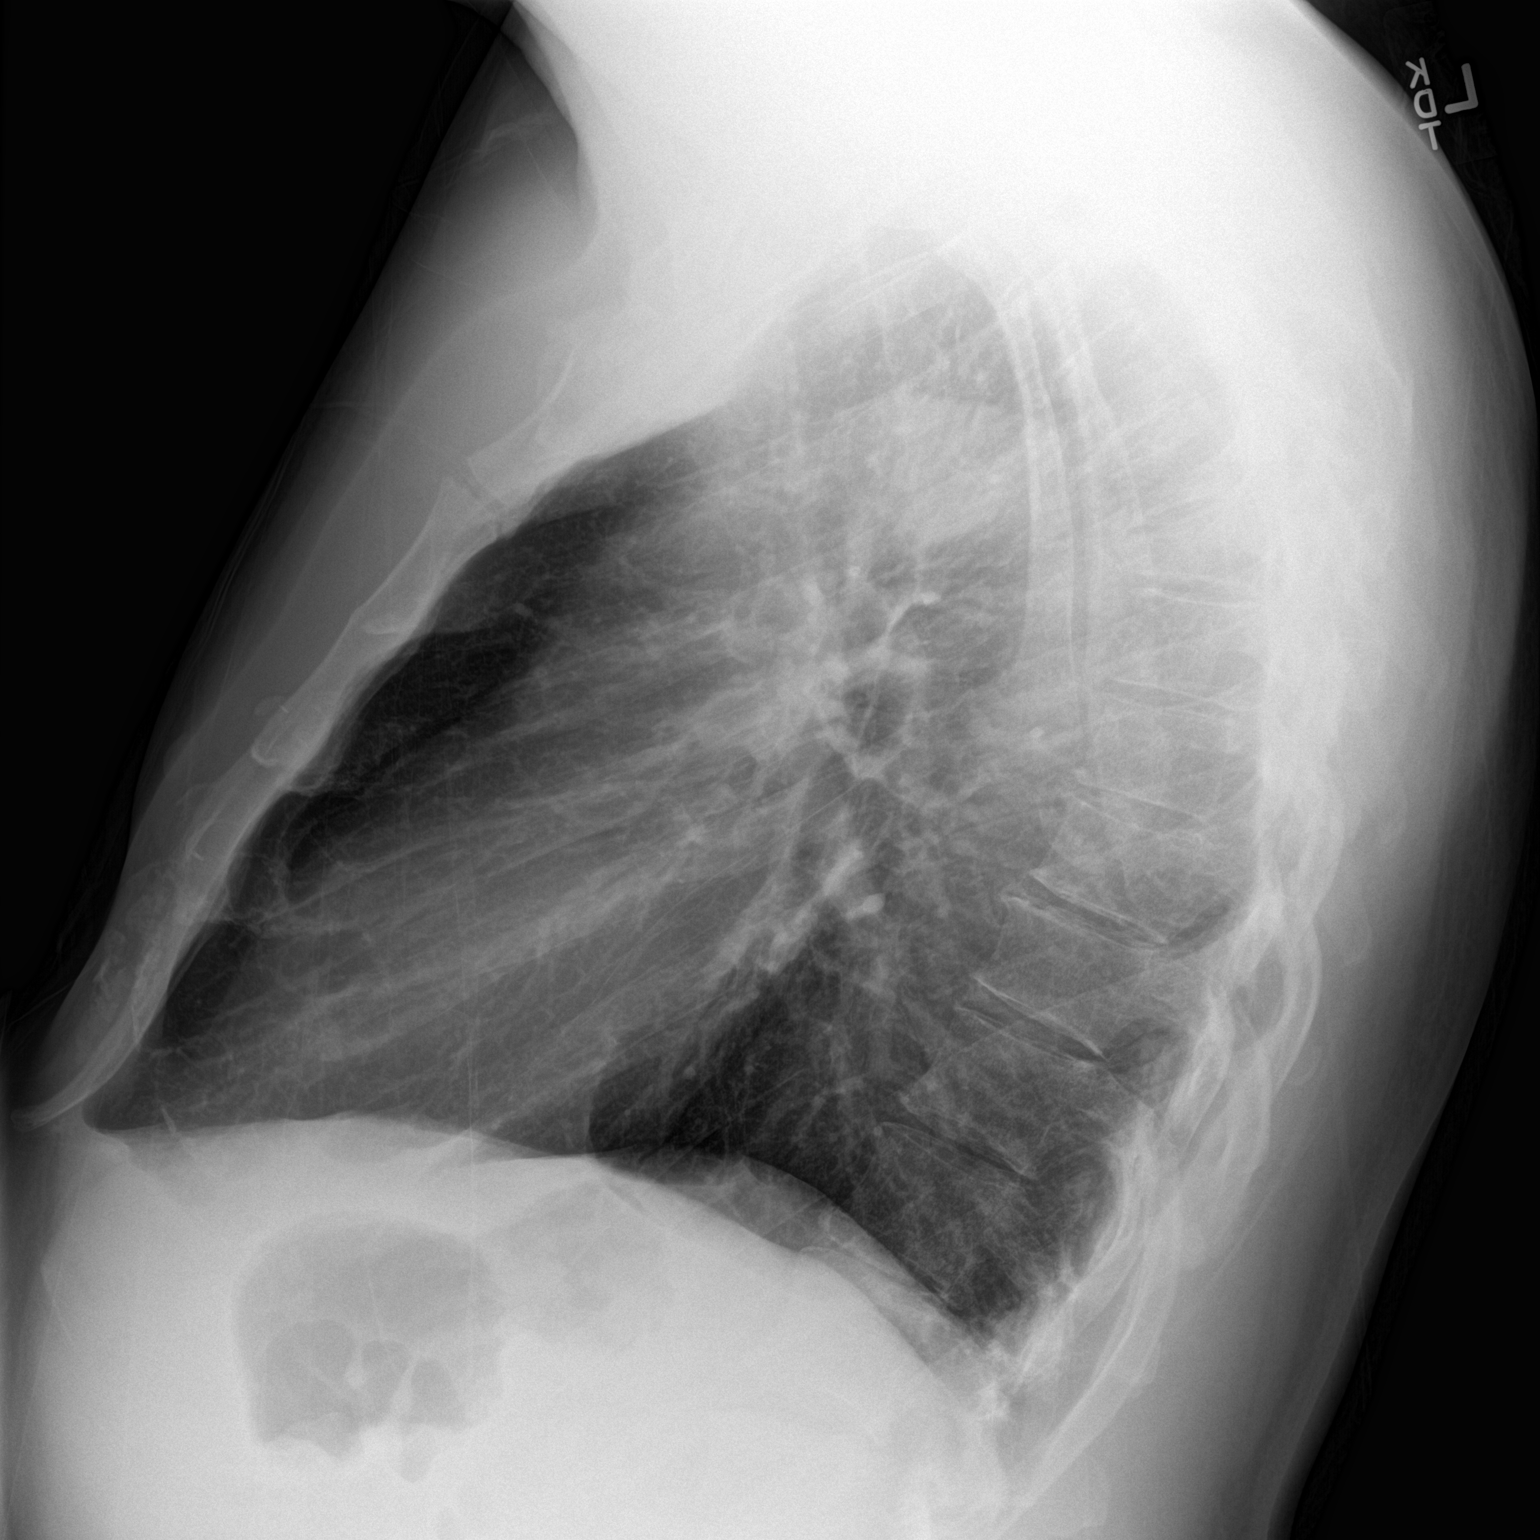

[2 of 2 positions shown; findings below may reference images not displayed]

FINDINGS: The lungs are well-aerated and clear. There is no evidence of focal
opacification, pleural effusion or pneumothorax.

The heart is normal in size; the mediastinal contour is within
normal limits. No acute osseous abnormalities are seen. Chronic
right-sided rib deformities are noted.
IMPRESSION: No acute cardiopulmonary process seen.

## 2016-09-07 IMAGING — CT CT HEAD W/O CM
2 series · 15 of 30 positions shown, 19 images · non-contrast
Comparison: None.

CLINICAL DATA: Code stroke. Difficulty talking. Worse headache of
life, with photosensitivity and arm numbness. Initial encounter.

EXAM:
CT HEAD WITHOUT CONTRAST
TECHNIQUE: Contiguous axial images were obtained from the base of the skull
through the vertex without intravenous contrast.

[Series 201: head w/o, idose (1) · axial · non-contrast · 0.49mm/px · z∈[+58,+188]mm · 13 of 32 slices shown, 17 images]
[im 3/32  brain]
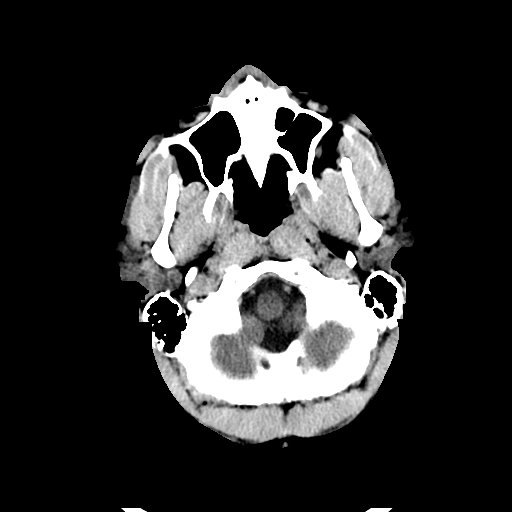
[im 3/32  bone]
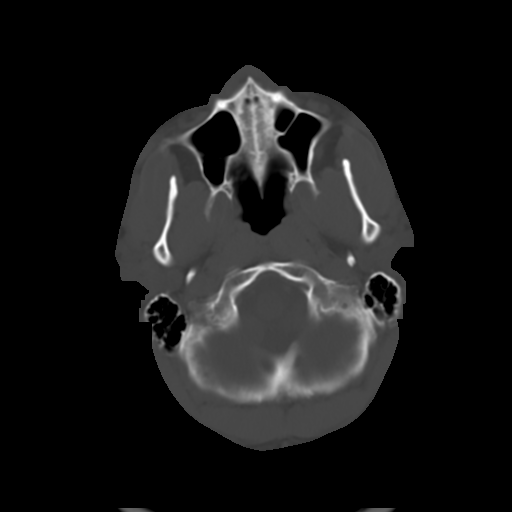
[im 5/32  brain]
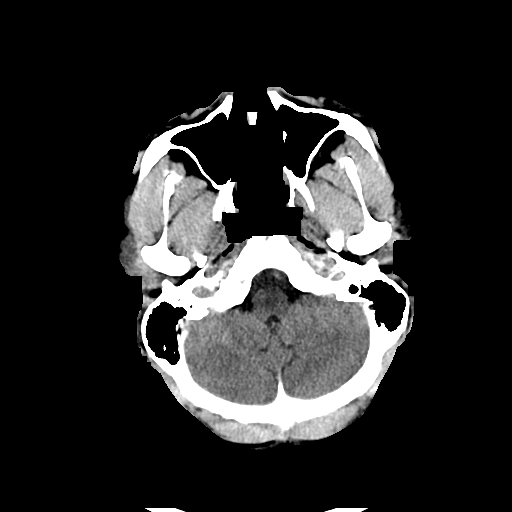
[im 7/32  brain]
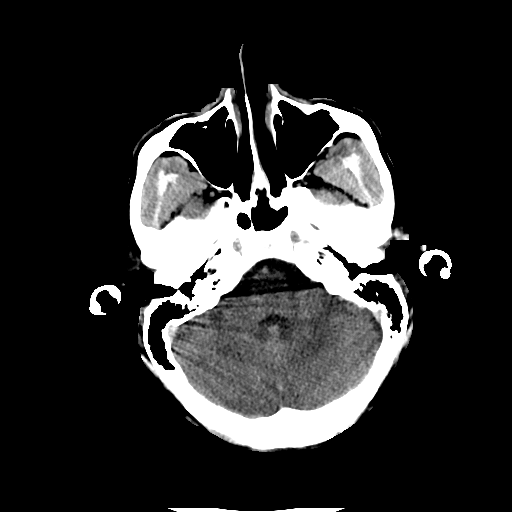
[im 9/32  brain]
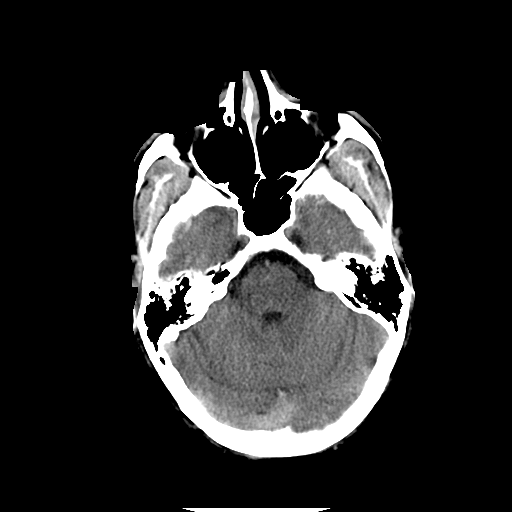
[im 12/32  brain]
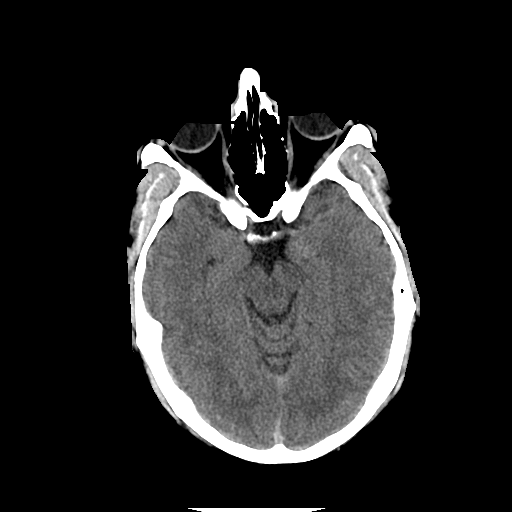
[im 12/32  bone]
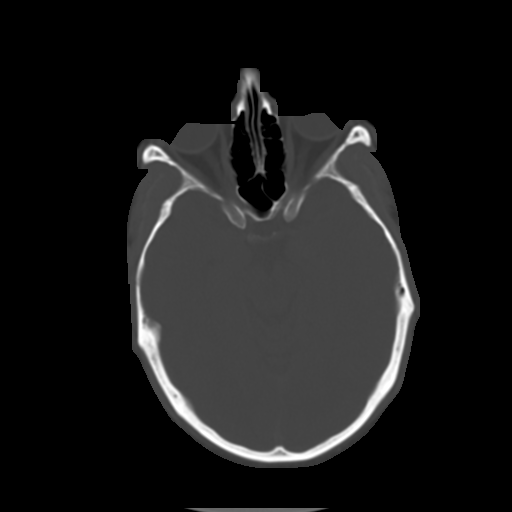
[im 14/32  brain]
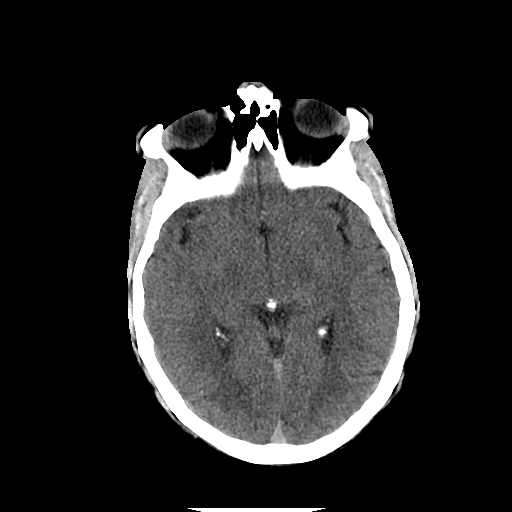
[im 16/32  brain]
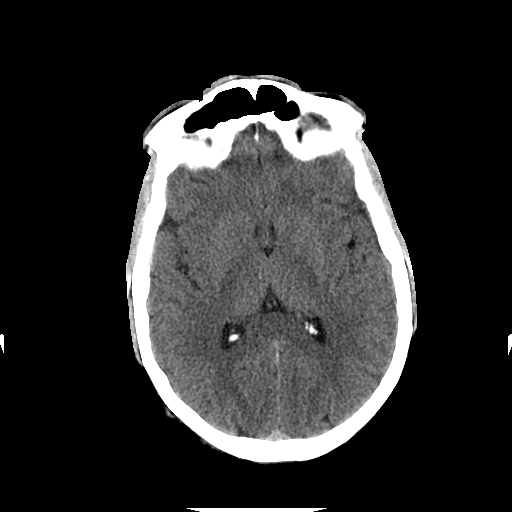
[im 18/32  brain]
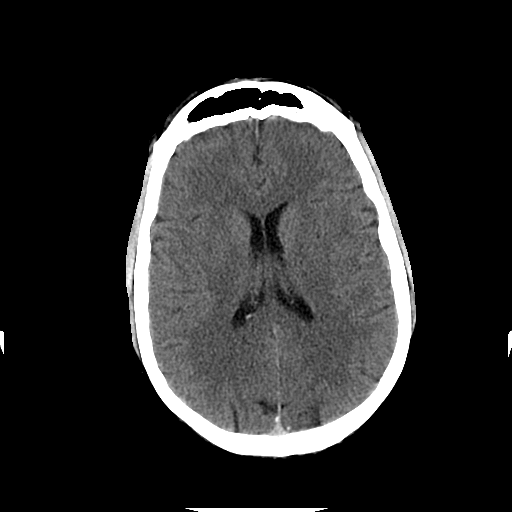
[im 20/32  brain]
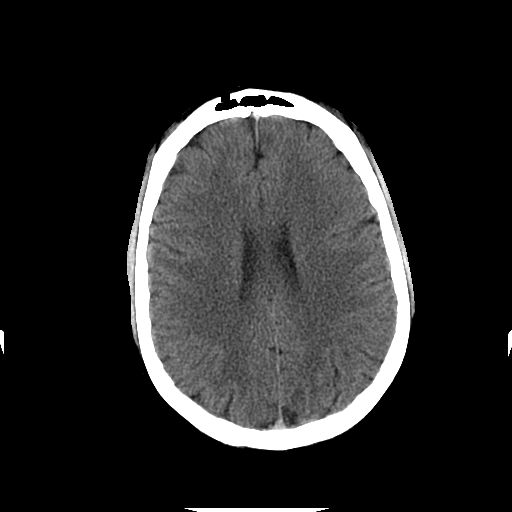
[im 20/32  bone]
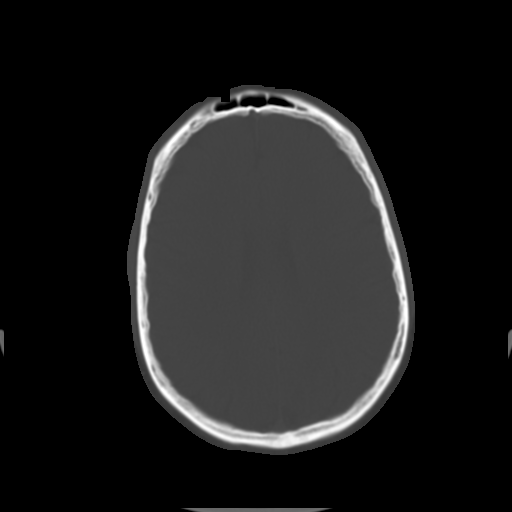
[im 23/32  brain]
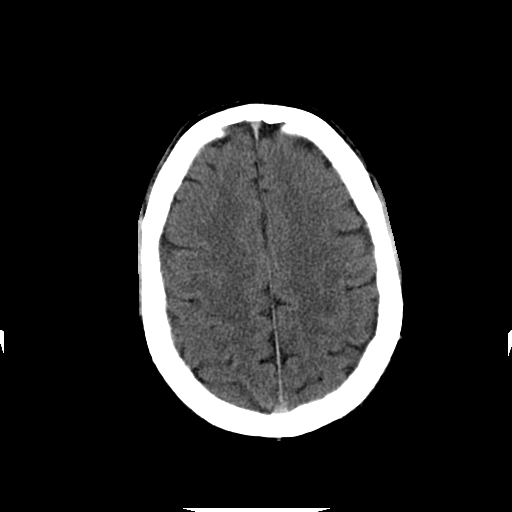
[im 25/32  brain]
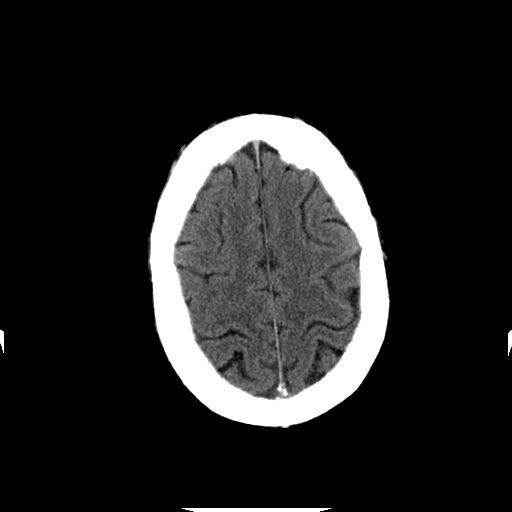
[im 27/32  brain]
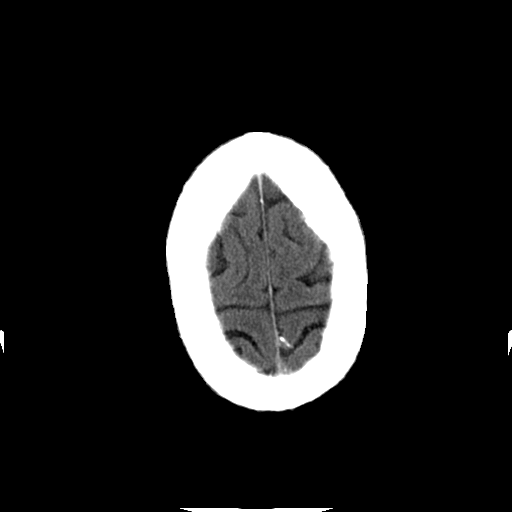
[im 29/32  brain]
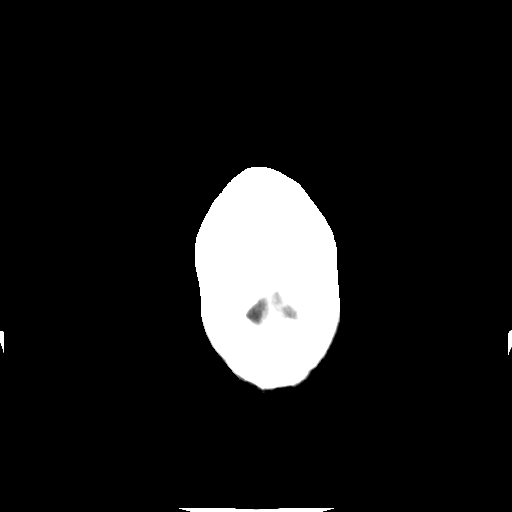
[im 29/32  bone]
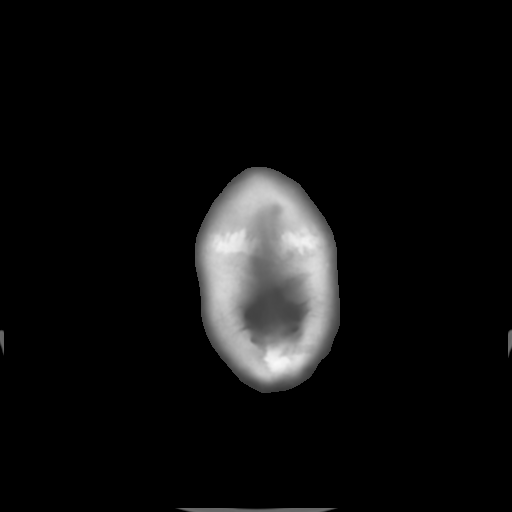

[Series 202: head w/o bone, idose (1) · axial · non-contrast · 0.49mm/px · z∈[+58,+78]mm · 2 of 32 slices shown]
[im 3/32  bone]
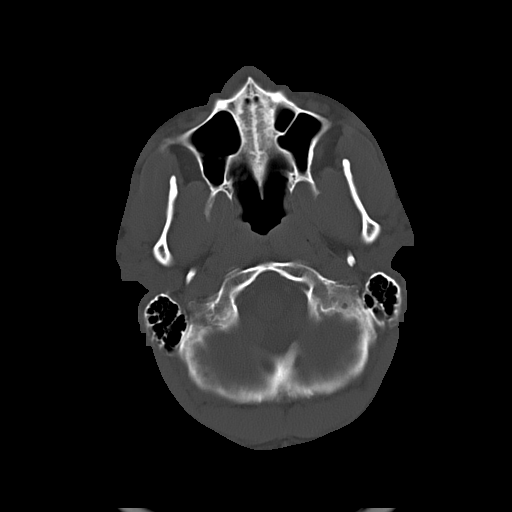
[im 7/32  bone]
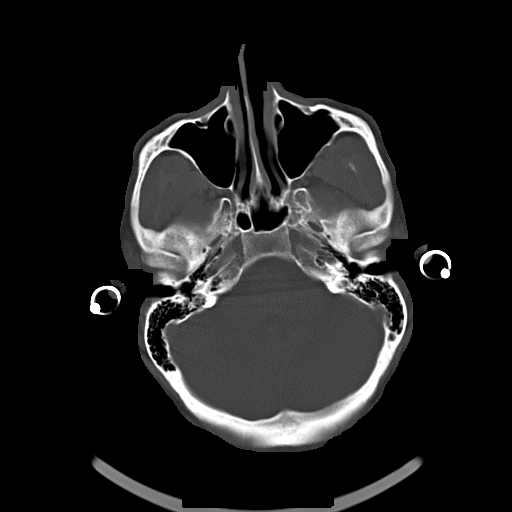

[15 of 30 positions shown; findings below may reference images not displayed]

FINDINGS: There is no evidence of acute infarction, mass lesion, or intra- or
extra-axial hemorrhage on CT.

The posterior fossa, including the cerebellum, brainstem and fourth
ventricle, is within normal limits. The third and lateral
ventricles, and basal ganglia are unremarkable in appearance. The
cerebral hemispheres are symmetric in appearance, with normal
gray-white differentiation. No mass effect or midline shift is seen.

There is no evidence of fracture; visualized osseous structures are
unremarkable in appearance. The orbits are within normal limits. The
paranasal sinuses and mastoid air cells are well-aerated. No
significant soft tissue abnormalities are seen.
IMPRESSION: Unremarkable noncontrast CT of the head.

These results were called by telephone at the time of interpretation
on 03/21/2015 at [DATE] to Dr. Nya, who verbally acknowledged
these results.

## 2018-05-29 ENCOUNTER — Emergency Department (HOSPITAL_COMMUNITY): Payer: Self-pay

## 2018-05-29 ENCOUNTER — Emergency Department (HOSPITAL_COMMUNITY)
Admission: EM | Admit: 2018-05-29 | Discharge: 2018-05-29 | Disposition: A | Discharge status: Home / Self Care | Payer: Self-pay | Attending: Emergency Medicine | Admitting: Emergency Medicine

## 2018-05-29 ENCOUNTER — Encounter (HOSPITAL_COMMUNITY): Payer: Self-pay | Admitting: *Deleted

## 2018-05-29 DIAGNOSIS — Y939 Activity, unspecified: Secondary | ICD-10-CM | POA: Insufficient documentation

## 2018-05-29 DIAGNOSIS — W19XXXA Unspecified fall, initial encounter: Secondary | ICD-10-CM

## 2018-05-29 DIAGNOSIS — S93401A Sprain of unspecified ligament of right ankle, initial encounter: Secondary | ICD-10-CM

## 2018-05-29 DIAGNOSIS — M25561 Pain in right knee: Secondary | ICD-10-CM | POA: Insufficient documentation

## 2018-05-29 DIAGNOSIS — Z79899 Other long term (current) drug therapy: Secondary | ICD-10-CM | POA: Insufficient documentation

## 2018-05-29 DIAGNOSIS — M25562 Pain in left knee: Secondary | ICD-10-CM

## 2018-05-29 DIAGNOSIS — Y999 Unspecified external cause status: Secondary | ICD-10-CM | POA: Insufficient documentation

## 2018-05-29 DIAGNOSIS — S82302A Unspecified fracture of lower end of left tibia, initial encounter for closed fracture: Secondary | ICD-10-CM

## 2018-05-29 DIAGNOSIS — I10 Essential (primary) hypertension: Secondary | ICD-10-CM | POA: Insufficient documentation

## 2018-05-29 DIAGNOSIS — Y929 Unspecified place or not applicable: Secondary | ICD-10-CM | POA: Insufficient documentation

## 2018-05-29 DIAGNOSIS — X58XXXA Exposure to other specified factors, initial encounter: Secondary | ICD-10-CM | POA: Insufficient documentation

## 2018-05-29 MED ORDER — HYDROCODONE-ACETAMINOPHEN 5-325 MG PO TABS: 1.0000 | ORAL_TABLET | Freq: Four times a day (QID) | ORAL | 0 refills | Status: AC | PRN

## 2018-05-29 MED ORDER — IBUPROFEN 800 MG PO TABS
800.0000 mg | ORAL_TABLET | Freq: Once | ORAL | Status: AC
  Administered 2018-05-29: 800 mg via ORAL
  Filled 2018-05-29: qty 1

## 2018-05-29 NOTE — ED Notes (Signed)
Pt verbalized understanding of discharge paperwork and follow-up care.  °

## 2018-05-29 NOTE — ED Triage Notes (Signed)
Pt in after a fall at work, states he typically has some dizziness when he stands up quickly, today he stood up and was holding a piece of equipment, while he was getting his bearings someone opened a door and knocked him down, states both his legs bent under him and he heard popping in his knees and ankles, no deformity noted

## 2018-05-29 NOTE — Discharge Instructions (Signed)
Take ibuprofen 3 times a day with meals.  Take 800 mg (4 pills) at a time. Do not take other anti-inflammatories at the same time (Advil, Motrin, naproxen, Aleve). You may supplement with Tylenol if you need further pain control. Use norco as needed for severe or breakthrough pain. Have caution, this may make you tired or groggy, and increase your risk for falls. Do not drive while taking this medication.  Use ice packs for pain and swelling. Keep your leg elevated when able.  Follow up with orthopedics for further evaluation.  Return to the ER with any new, worsening, or concerning symptoms.

## 2018-05-29 NOTE — Progress Notes (Signed)
Orthopedic Tech Progress Note Patient Details:  Brett Herrera 1976/02/24 [id number]  Ortho Devices Type of Ortho Device: CAM walker Ortho Device/Splint Location: LLE Ortho Device/Splint Interventions: Ordered, Application   Post Interventions Patient Tolerated: Well Instructions Provided: Care of device   Jennye MoccasinHughes, [name] 05/29/2018, 1:34 PM

## 2018-05-29 NOTE — ED Provider Notes (Signed)
[facility]   CSN: [id number] Arrival date & time: 05/29/18  1001     History   Chief Complaint Chief Complaint  Patient presents with  . Fall    HPI Brett Herrera is a 42 y.o. male resenting for evaluation after fall.  Patient states he was in the process of opening a car door when somebody else pushed the door open on the other side, causing him to rapidly squat down.  He reports acute onset of pain of bilateral lateral feet, ankles, and posterior knees.  He has been unable to walk since due to the pain.  This happened just prior to arrival.  He has not taken anything for pain including Tylenol or ibuprofen.  He denies numbness or tingling.  He does report surgery on his right ankle in 2004, and a severe fracture of his left ankle in 2017 that was never surgically repaired.  He states he has no medical problems, takes no medications daily.  Pain is constant, worse with movement and palpation.  Nothing makes it better.  Patient denies hitting his head or loss of consciousness.  No injury also.  Denies headache, neck pain, back pain, hip pain.  HPI  Past Medical History:  Diagnosis Date  . Chronic back pain   . Hypertension   . Pancreatitis     There are no active problems to display for this patient.   Past Surgical History:  Procedure Laterality Date  . ORTHOPEDIC SURGERY          Home Medications    Prior to Admission medications   Medication Sig Start Date End Date Taking? Authorizing Provider  cyclobenzaprine (FLEXERIL) 10 MG tablet Take 1 tablet (10 mg total) by mouth 2 (two) times daily as needed for muscle spasms. 09/21/15   Dowless, Lelon MastSamantha Tripp, PA-C  HYDROcodone-acetaminophen (NORCO/VICODIN) 5-325 MG tablet Take 1 tablet by mouth every 6 (six) hours as needed for severe pain. 05/29/18   Raileigh Sabater, PA-C  Multiple Vitamin (MULTIVITAMIN WITH MINERALS) TABS tablet Take 1 tablet by mouth daily.     [provider]  naproxen (NAPROSYN) 500 MG tablet Take 1 tablet (500 mg total) by mouth 2 (two) times daily. 01/19/16   Fayrene Helperran, [place], PA-C    Family History History reviewed. No pertinent family history.  Social History Social History   Tobacco Use  . Smoking status: Never Smoker  . Smokeless tobacco: Never Used  Substance Use Topics  . Alcohol use: No  . Drug use: No     Allergies   Patient has no known allergies.   Review of Systems Review of Systems  Musculoskeletal: Positive for arthralgias and joint swelling.  Neurological: Negative for numbness.  Hematological: Does not bruise/bleed easily.     Physical Exam Updated Vital Signs BP (!) 128/93   Pulse (!) 58   Temp 98.5 F (36.9 C) (Oral)   Resp 17   SpO2 99%   Physical Exam  Constitutional: He is oriented to person, place, and time. He appears well-developed and well-nourished. No distress.  Sitting in the wheelchair no acute distress  HENT:  Head: Normocephalic and atraumatic.  Eyes: EOM are normal.  Neck: Normal range of motion.  Pulmonary/Chest: Effort normal.  Abdominal: He exhibits no distension.  Musculoskeletal: He exhibits edema and tenderness.  Bilateral knee appear mildly swollen.  Bilateral ankles appear swollen.  Pedal pulses intact bilaterally.  Sensation intact bilaterally.  Good cap refill of all toes toes.  No tenderness palpation of calves or shins.  Tenderness palpation of the lateral right knee and posterior knee.  No tenderness palpation of anterior medial.  No obvious deformity.  Tenderness palpation of left lateral ankle and foot without acute deformity.  Achilles palpable and intact. Tenderness palpation of medial right knee and posterior knee.  No trans-palpation of right calf or shin.  Tenderness palpation of lateral right ankle and foot.  Achilles palpable and intact.   Neurological: He is alert and oriented to person, place, and time. No sensory deficit.  Skin: Skin is warm.  Capillary refill takes less than 2 seconds. No rash noted.  Psychiatric: He has a normal mood and affect.  Nursing note and vitals reviewed.    ED Treatments / Results  Labs (all labs ordered are listed, but only abnormal results are displayed) Labs Reviewed - No data to display  EKG None  Radiology Dg Ankle Complete Left  Result Date: 05/29/2018 CLINICAL DATA:  Pain secondary to a fall. EXAM: LEFT ANKLE COMPLETE - 3+ VIEW COMPARISON:  Radiographs dated 01/19/2016 FINDINGS: There's a possible hairline fracture of the posterior malleolus of the distal tibia only seen on the lateral view. Probable small ankle effusion. Soft tissue swelling over the lateral malleolus. IMPRESSION: Possible hairline fracture of the posterior malleolus of the distal tibia. Probable small ankle effusion. Soft tissue swelling over the lateral malleolus. Electronically Signed   By: Francene Boyers M.D.   On: 05/29/2018 12:20   Dg Ankle Complete Right  Result Date: 05/29/2018 CLINICAL DATA:  Pain secondary to a fall. EXAM: RIGHT ANKLE - COMPLETE 3+ VIEW COMPARISON:  12/23/2014 FINDINGS: No acute fracture or dislocation. Old healed fracture of the distal fibula with previous open reduction and internal fixation. Arthritic changes tibiotalar joint. Ankle joint effusion. Calcification of the distal tibiofibular syndesmosis. IMPRESSION: No acute osseous abnormality. Arthritic changes. Ankle joint effusion. Electronically Signed   By: Francene Boyers M.D.   On: 05/29/2018 12:17   Dg Knee Complete 4 Views Left  Result Date: 05/29/2018 CLINICAL DATA:  Pain secondary to a fall. EXAM: LEFT KNEE - COMPLETE 4+ VIEW COMPARISON:  None. FINDINGS: No acute abnormalities. Small joint effusion. Slight marginal osteophyte formation in all 3 compartments. IMPRESSION: No acute osseous abnormality. Small joint effusion. Slight tricompartmental osteoarthritis. Electronically Signed   By: Francene Boyers M.D.   On: 05/29/2018 12:16   Dg  Knee Complete 4 Views Right  Result Date: 05/29/2018 CLINICAL DATA:  Right knee pain secondary to a fall. EXAM: RIGHT KNEE - COMPLETE 4+ VIEW COMPARISON:  None. FINDINGS: No evidence of fracture, dislocation, or joint effusion. No evidence of arthropathy or other focal bone abnormality. Soft tissues are unremarkable. IMPRESSION: Negative. Electronically Signed   By: Francene Boyers M.D.   On: 05/29/2018 12:13   Dg Foot Complete Left  Result Date: 05/29/2018 CLINICAL DATA:  Pain secondary to a fall. EXAM: LEFT FOOT - COMPLETE 3+ VIEW COMPARISON:  01/19/2016 FINDINGS: There is no evidence of fracture or dislocation. There is no evidence of arthropathy or other focal bone abnormality. Soft tissue swelling over the lateral malleolus. IMPRESSION: No acute osseous abnormality. Soft tissue swelling over the lateral malleolus. Electronically Signed   By: Francene Boyers M.D.   On: 05/29/2018 12:14   Dg Foot Complete Right  Result Date: 05/29/2018 CLINICAL DATA:  Pain secondary to a fall. EXAM: RIGHT FOOT COMPLETE - 3+ VIEW COMPARISON:  Radiographs dated 12/23/2014 FINDINGS: There is no evidence of fracture or dislocation. Arthritic changes  at the ankle. Previous open reduction and internal fixation of distal fibula fracture. The fracture is healed. The bones otherwise appear normal. Soft tissues are unremarkable. IMPRESSION: No acute abnormalities. Chronic arthritic changes at the ankle joint. Electronically Signed   By: Francene Boyers M.D.   On: 05/29/2018 12:10    Procedures Procedures (including critical care time)  Medications Ordered in ED Medications  ibuprofen (ADVIL,MOTRIN) tablet 800 mg (800 mg Oral Given 05/29/18 1118)     Initial Impression / Assessment and Plan / ED Course  I have reviewed the triage vital signs and the nursing notes.  Pertinent labs & imaging results that were available during my care of the patient were reviewed by me and considered in my medical decision making (see  chart for details).     Patient presenting for evaluation of bilateral knee, hip, and foot pain.  Physical exam reassuring, he is neurovascularly intact.  Pain is mostly overlying more muscles/tendons insert, as opposed over bony surfaces.  However, patient does have swelling of multiple joints, and acute onset pain.  Will obtain x-rays of bilateral knees, ankles, and feet for further evaluation.  Ibuprofen given for pain.  X-rays viewed interpreted by me.  Left ankle x-ray with possible fracture of the distal tibia.  No other obvious fracture dislocation noted.  Discussed with Earney Hamburg, PA-C from orthopedics, who recommends short leg splint and follow-up in the office.  Discussed with patient.  Patient does not want nonremovable splint.  Offered cam walker, patient is agreeable to this.  Discussed pain control with Tylenol and ibuprofen.  Norco as needed for severe breakthrough pain.  PMP checked, patient without narcotic prescription.  Patient encouraged to follow-up with orthopedics for further evaluation.  At this time, patient appears safe for discharge.  Return precautions given.  Patient states he understands and agrees to plan.   Final Clinical Impressions(s) / ED Diagnoses   Final diagnoses:  Closed fracture of distal end of left tibia, unspecified fracture morphology, initial encounter  Acute bilateral knee pain  Sprain of right ankle, unspecified ligament, initial encounter  Fall, initial encounter    ED Discharge Orders         Ordered    HYDROcodone-acetaminophen (NORCO/VICODIN) 5-325 MG tablet  Every 6 hours PRN     05/29/18 1314           Aryannah Mohon, PA-C 05/29/18 1509    Mancel Bale, MD 05/30/18 2116

## 2018-07-11 ENCOUNTER — Encounter (HOSPITAL_COMMUNITY): Payer: Self-pay | Admitting: Emergency Medicine

## 2018-07-11 ENCOUNTER — Emergency Department (HOSPITAL_COMMUNITY)
Admission: EM | Admit: 2018-07-11 | Discharge: 2018-07-11 | Disposition: A | Discharge status: Home / Self Care | Payer: Self-pay | Attending: Emergency Medicine | Admitting: Emergency Medicine

## 2018-07-11 DIAGNOSIS — I10 Essential (primary) hypertension: Secondary | ICD-10-CM | POA: Insufficient documentation

## 2018-07-11 DIAGNOSIS — Z79899 Other long term (current) drug therapy: Secondary | ICD-10-CM | POA: Insufficient documentation

## 2018-07-11 DIAGNOSIS — K047 Periapical abscess without sinus: Secondary | ICD-10-CM | POA: Insufficient documentation

## 2018-07-11 MED ORDER — CLINDAMYCIN HCL 150 MG PO CAPS: 300.0000 mg | ORAL_CAPSULE | Freq: Three times a day (TID) | ORAL | 0 refills | Status: AC

## 2018-07-11 MED ORDER — NAPROXEN 500 MG PO TABS: 500.0000 mg | ORAL_TABLET | Freq: Two times a day (BID) | ORAL | 0 refills | Status: AC

## 2018-07-11 NOTE — Discharge Instructions (Addendum)
Call Dr. Randa Evens office to schedule an appointment for re-evaluation and further management within the next 48 hours.   I have prescribed you Clindamycin which is an antibiotic to treat the infection and Naproxen which is an anti-inflammatory medicine to treat the pain.   Please take all of your antibiotics until finished. You may develop abdominal discomfort or diarrhea from the antibiotic.  You may help offset this with probiotics which you can buy at the store (ask your pharmacist if unable to find) or get probiotics in the form of eating yogurt. Do not eat or take the probiotics until 2 hours after your antibiotic. If you are unable to tolerate these side effects follow-up with your primary care provider or return to the emergency department.   If you begin to experience any blistering, rashes, swelling, or difficulty breathing seek medical care for evaluation of potentially more serious side effects.   Be sure to eat something when taking the Naproxen as it can cause stomach upset and at worst stomach bleeding. Do not take additional non steroidal anti-inflammatory medicines such as Ibuprofen, Aleve, Advil, Mobic, Diclofenac, or goodie powder while taking Naproxen. You may supplement with Tylenol.   We have prescribed you new medication(s) today. Discuss the medications prescribed today with your pharmacist as they can have adverse effects and interactions with your other medicines including over the counter and prescribed medications. Seek medical evaluation if you start to experience new or abnormal symptoms after taking one of these medicines, seek care immediately if you start to experience difficulty breathing, feeling of your throat closing, facial swelling, or rash as these could be indications of a more serious allergic reaction  If you start to experience and new or worsening symptoms return to the emergency department. If you start to experience fever, chills, neck stiffness/pain, or  inability to move your neck or open your mouth come back to the emergency department immediately.

## 2018-07-11 NOTE — ED Provider Notes (Signed)
[facility] EMERGENCY DEPARTMENT Provider Note   CSN: [id number] Arrival date & time: 07/11/18  1009     History   Chief Complaint Chief Complaint  Patient presents with  . Facial Swelling    HPI Brett Herrera is a 43 y.o. male with a hx of HTN and pancreatitis who presents to the ED with complaints of L sided facial swelling that began when he woke up this AM. He states swelling has been constant without specific alleviating/aggravating factors. He relays that the area is somewhat uncomfortable, but more irritating than actually painful. He states he does have long standing hx of dental issues, no specific dental pain. Denies fever, chills, URI sxs, eye pain, change in vision, vomiting, new foods/producs, difficulty breathing, other swelling, or rashes.   HPI  Past Medical History:  Diagnosis Date  . Chronic back pain   . Hypertension   . Pancreatitis     There are no active problems to display for this patient.   Past Surgical History:  Procedure Laterality Date  . ORTHOPEDIC SURGERY          Home Medications    Prior to Admission medications   Medication Sig Start Date End Date Taking? Authorizing Provider  cyclobenzaprine (FLEXERIL) 10 MG tablet Take 1 tablet (10 mg total) by mouth 2 (two) times daily as needed for muscle spasms. 09/21/15   Dowless, Lelon Mast Tripp, PA-C  HYDROcodone-acetaminophen (NORCO/VICODIN) 5-325 MG tablet Take 1 tablet by mouth every 6 (six) hours as needed for severe pain. 05/29/18   Caccavale, Sophia, PA-C  Multiple Vitamin (MULTIVITAMIN WITH MINERALS) TABS tablet Take 1 tablet by mouth daily.    [provider]  naproxen (NAPROSYN) 500 MG tablet Take 1 tablet (500 mg total) by mouth 2 (two) times daily. 01/19/16   Fayrene Helper, PA-C    Family History No family history on file.  Social History Social History   Tobacco Use  . Smoking status: Never Smoker  . Smokeless tobacco: Never Used  Substance Use Topics    . Alcohol use: No  . Drug use: No     Allergies   Patient has no known allergies.   Review of Systems Review of Systems  Constitutional: Negative for chills and fever.  HENT: Positive for facial swelling (& irritation). Negative for congestion, ear pain, sore throat, trouble swallowing and voice change.   Respiratory: Negative for shortness of breath.   Cardiovascular: Negative for chest pain.  Gastrointestinal: Negative for nausea and vomiting.  Skin: Negative for rash.     Physical Exam Updated Vital Signs BP (!) 123/94   Pulse (!) 55   Temp 98.2 F (36.8 C) (Oral)   Resp 15   SpO2 99%   Physical Exam Vitals signs and nursing note reviewed.  Constitutional:      General: He is not in acute distress.    Appearance: He is well-developed. He is not toxic-appearing.  HENT:     Head: Normocephalic and atraumatic.     Comments: Left sided facial swelling noted to the L maxillary area extending to inferior orbital area.  No palpable fluctuance or induration. Not erythematous or warm to touch. No tenderness to overlying skin    Right Ear: Tympanic membrane normal. Tympanic membrane is not perforated, erythematous, retracted or bulging.     Left Ear: Tympanic membrane normal. Tympanic membrane is not perforated, erythematous, retracted or bulging.     Nose: Nose normal.     Right Nostril: No foreign  body, septal hematoma or occlusion.     Left Nostril: No foreign body, septal hematoma or occlusion.     Mouth/Throat:     Pharynx: Uvula midline. No oropharyngeal exudate or posterior oropharyngeal erythema.     Comments: Patient has poor dentition throughout with multiple dental caries and tooth decay noted. Left upper gingiva is mildly erythematous and swollen without palpable fluctuance or gross abscess. Tender to left upper alveolar area.    Posterior oropharynx is symmetric appearing. Patient tolerating own secretions without difficulty. Airway is patent. No trismus. No  drooling. No hot potato voice. No swelling beneath the tongue, submandibular compartment is soft.  Eyes:     General:        Right eye: No discharge.        Left eye: No discharge.     Extraocular Movements: Extraocular movements intact.     Conjunctiva/sclera: Conjunctivae normal.     Comments: No proptosis. Painless EOMI.   Neck:     Musculoskeletal: Normal range of motion and neck supple.  Cardiovascular:     Rate and Rhythm: Regular rhythm. Bradycardia present.  Pulmonary:     Effort: Pulmonary effort is normal. No respiratory distress.  Abdominal:     General: There is no distension.  Lymphadenopathy:     Cervical: No cervical adenopathy.  Neurological:     Mental Status: He is alert.  Psychiatric:        Behavior: Behavior normal.        Thought Content: Thought content normal.      ED Treatments / Results  Labs (all labs ordered are listed, but only abnormal results are displayed) Labs Reviewed - No data to display  EKG None  Radiology No results found.  Procedures Procedures (including critical care time)  Medications Ordered in ED Medications - No data to display   Initial Impression / Assessment and Plan / ED Course  I have reviewed the triage vital signs and the nursing notes.  Pertinent labs & imaging results that were available during my care of the patient were reviewed by me and considered in my medical decision making (see chart for details).   Patient presents to the ED with complaints of L sided facial swelling. Nontoxic appearing, vitals without significant abnormality. Exam notable for L sided facial swelling without overlying skin changes. No erythema/warmth. Specifically no periorbital erythema/warmth, no limitation in EOM, no proptosis, doubt orbital/periorbital cellulitis. No overlying skin changes or palpable external tenderness to suggest maxillary sinus etiology or overlying skin abscess. Patient has poor dentition throughout with left  upper gingival erythema and tenderness to the alveolar area. No gross dental abscess. Exam not consistent with ludwigs. Will cover with clindamycin & naproxen w/ oral surgery follow up. I discussed treatment plan, need for follow-up, and return precautions with the patient. Provided opportunity for questions, patient confirmed understanding and is in agreement with plan.    Final Clinical Impressions(s) / ED Diagnoses   Final diagnoses:  Dental infection    ED Discharge Orders         Ordered    naproxen (NAPROSYN) 500 MG tablet  2 times daily     07/11/18 1043    clindamycin (CLEOCIN) 150 MG capsule  3 times daily     07/11/18 [address]1043           Petrucelli, ArtesiaSamantha R, PA-C 07/11/18 1047    Shaune PollackIsaacs, Cameron, MD 07/12/18 [phone]

## 2018-07-11 NOTE — ED Notes (Signed)
Patient verbalized understanding of dc instructions, vss, ambulatory upon discharge.  

## 2018-07-11 NOTE — ED Triage Notes (Signed)
Pt reports waking up with swelling and pain to L side of his face, hx of dental abcesses.

## 2020-04-27 DIAGNOSIS — J1282 Pneumonia due to coronavirus disease 2019: Secondary | ICD-10-CM

## 2020-05-01 DIAGNOSIS — J1282 Pneumonia due to coronavirus disease 2019: Secondary | ICD-10-CM
